# Patient Record
Sex: Female | Born: 1955
Health system: Southern US, Community
[De-identification: ages and names within clinical notes are randomized; demographics above are authoritative.]

## PROBLEM LIST (undated history)

## (undated) DIAGNOSIS — I499 Cardiac arrhythmia, unspecified: Secondary | ICD-10-CM

## (undated) HISTORY — DX: Cardiac arrhythmia, unspecified: I49.9

---

## 2016-09-16 DIAGNOSIS — H33002 Unspecified retinal detachment with retinal break, left eye: Secondary | ICD-10-CM | POA: Diagnosis not present

## 2017-01-13 ENCOUNTER — Other Ambulatory Visit: Payer: Self-pay | Admitting: Obstetrics & Gynecology

## 2017-01-13 DIAGNOSIS — Z1231 Encounter for screening mammogram for malignant neoplasm of breast: Secondary | ICD-10-CM

## 2017-01-29 ENCOUNTER — Ambulatory Visit: Payer: Self-pay | Admitting: Medical

## 2017-01-29 ENCOUNTER — Encounter: Payer: Self-pay | Admitting: Medical

## 2017-01-29 VITALS — BP 104/62 | HR 57 | Temp 97.2°F | Resp 16 | Ht 65.0 in | Wt 156.0 lb

## 2017-01-29 DIAGNOSIS — B351 Tinea unguium: Secondary | ICD-10-CM

## 2017-01-29 NOTE — Progress Notes (Addendum)
Patient states her ob/gyn doctor will be her primary care doctor.  Subjective:    Patient ID: Deborah Fitzpatrick, female    DOB: 1956/02/14, 61 y.o.   MRN: [id number]  HPI 61 yo female comes in for left great toe nail discoloration.  Not painful. Noticed it after hiking in an ill fitting pair of boots. She thought it may have been frost bitten, she states the nail has grown out but the discoloration remains. Presents with husband today in clinic. 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    Review of Systems  Constitutional: Negative for chills and fever.  Skin: Positive for color change.  under and involving left great toe nail only.     Objective:   Physical Exam  Constitutional: She is oriented to person, place, and time. She appears well-developed and well-nourished.  HENT:  Head: Normocephalic and atraumatic.  Eyes: Conjunctivae and EOM are normal. Pupils are equal, round, and reactive to light.  Neurological: She is alert and oriented to person, place, and time.  Skin: Skin is warm and dry.  Psychiatric: She has a normal mood and affect. Her behavior is normal.    Left great toe nail darkened and thickened.non-tender. Toe skin within normal limits <2s CR. No sign of infection or break in toe tissue.FROM.        Assessment & Plan:  Onychomycosis left great toe. Patient states last blood work was  5 years ago , so will do fasting labs to include CBC with diff, Met C, lipid panel , TSH thyroid panel, uric acid and vitamin D level. Then will review labs with patient. Will refer on to podiatry. Return to clinic as needed.Patient states her ob/gyn doctor will be her primary care doctor.

## 2017-02-09 ENCOUNTER — Ambulatory Visit
Admission: RE | Admit: 2017-02-09 | Discharge: 2017-02-09 | Disposition: A | Discharge status: Home / Self Care | Payer: BLUE CROSS/BLUE SHIELD | Source: Ambulatory Visit | Attending: Obstetrics & Gynecology | Admitting: Obstetrics & Gynecology

## 2017-02-09 DIAGNOSIS — Z1231 Encounter for screening mammogram for malignant neoplasm of breast: Secondary | ICD-10-CM | POA: Diagnosis not present

## 2017-02-10 ENCOUNTER — Other Ambulatory Visit: Payer: Self-pay

## 2017-02-10 DIAGNOSIS — Z0000 Encounter for general adult medical examination without abnormal findings: Secondary | ICD-10-CM

## 2017-02-10 NOTE — Addendum Note (Signed)
Addended by: Landry Lookingbill, Herbert Seta R on: 02/10/2017 01:33 PM   Modules accepted: Orders

## 2017-02-11 LAB — CMP12+LP+TP+TSH+6AC+CBC/D/PLT
A/G RATIO: 1.9 (ref 1.2–2.2)
ALBUMIN: 4.3 g/dL (ref 3.5–5.5)
ALT: 19 IU/L (ref 0–32)
AST: 22 IU/L (ref 0–40)
Alkaline Phosphatase: 62 IU/L (ref 39–117)
BASOS ABS: 0.1 10*3/uL (ref 0.0–0.2)
BUN / CREAT RATIO: 14 (ref 9–23)
BUN: 12 mg/dL (ref 6–24)
Basos: 2 %
Bilirubin Total: 0.7 mg/dL (ref 0.0–1.2)
CALCIUM: 9.4 mg/dL (ref 8.7–10.2)
CHOL/HDL RATIO: 2.2 ratio (ref 0.0–4.4)
CHOLESTEROL TOTAL: 172 mg/dL (ref 100–199)
CREATININE: 0.88 mg/dL (ref 0.57–1.00)
Chloride: 101 mmol/L (ref 96–106)
EOS (ABSOLUTE): 0.1 10*3/uL (ref 0.0–0.4)
EOS: 3 %
Estimated CHD Risk: <0.5 times avg. (ref 0.0–1.0)
FREE THYROXINE INDEX: 2.1 (ref 1.2–4.9)
GFR, EST AFRICAN AMERICAN: 83 mL/min/{1.73_m2} (ref >59)
GFR, EST NON AFRICAN AMERICAN: 72 mL/min/{1.73_m2} (ref >59)
GGT: 29 IU/L (ref 0–60)
GLOBULIN, TOTAL: 2.3 g/dL (ref 1.5–4.5)
Glucose: 89 mg/dL (ref 65–99)
HDL: 78 mg/dL (ref >39)
Hematocrit: 38.3 % (ref 34.0–46.6)
Hemoglobin: 13 g/dL (ref 11.1–15.9)
IMMATURE GRANS (ABS): 0 10*3/uL (ref 0.0–0.1)
IMMATURE GRANULOCYTES: 0 %
IRON: 106 ug/dL (ref 27–159)
LDH: 272 IU/L — ABNORMAL HIGH (ref 119–226)
LDL Calculated: 82 mg/dL (ref 0–99)
Lymphocytes Absolute: 2 10*3/uL (ref 0.7–3.1)
Lymphs: 41 %
MCH: 29.9 pg (ref 26.6–33.0)
MCHC: 33.9 g/dL (ref 31.5–35.7)
MCV: 88 fL (ref 79–97)
MONOCYTES: 9 %
Monocytes Absolute: 0.4 10*3/uL (ref 0.1–0.9)
Neutrophils Absolute: 2.2 10*3/uL (ref 1.4–7.0)
Neutrophils: 45 %
PHOSPHORUS: 3.4 mg/dL (ref 2.5–4.5)
PLATELETS: 194 10*3/uL (ref 150–379)
Potassium: 4.6 mmol/L (ref 3.5–5.2)
RBC: 4.35 x10E6/uL (ref 3.77–5.28)
RDW: 14 % (ref 12.3–15.4)
Sodium: 140 mmol/L (ref 134–144)
T3 Uptake Ratio: 30 % (ref 24–39)
T4 TOTAL: 6.9 ug/dL (ref 4.5–12.0)
TOTAL PROTEIN: 6.6 g/dL (ref 6.0–8.5)
TRIGLYCERIDES: 62 mg/dL (ref 0–149)
TSH: 2.41 u[IU]/mL (ref 0.450–4.500)
Uric Acid: 5.3 mg/dL (ref 2.5–7.1)
VLDL Cholesterol Cal: 12 mg/dL (ref 5–40)
WBC: 4.8 10*3/uL (ref 3.4–10.8)

## 2017-02-11 LAB — VITAMIN D 25 HYDROXY (VIT D DEFICIENCY, FRACTURES): VIT D 25 HYDROXY: 13.9 ng/mL — AB (ref 30.0–100.0)

## 2017-02-12 ENCOUNTER — Ambulatory Visit: Payer: BLUE CROSS/BLUE SHIELD | Admitting: Medical

## 2017-02-13 ENCOUNTER — Telehealth: Payer: Self-pay | Admitting: Medical

## 2017-02-13 NOTE — Telephone Encounter (Signed)
Called and left message for patient to return my phone call. Needing to review her labs , and release to my chart.

## 2017-02-14 NOTE — Progress Notes (Unsigned)
Called patient , no answer, left message to return my call. For lab review.

## 2017-02-16 ENCOUNTER — Encounter: Payer: Self-pay | Admitting: Medical

## 2017-02-19 ENCOUNTER — Ambulatory Visit: Payer: BLUE CROSS/BLUE SHIELD | Admitting: Medical

## 2017-02-19 ENCOUNTER — Telehealth: Payer: Self-pay | Admitting: Medical

## 2017-02-19 DIAGNOSIS — E559 Vitamin D deficiency, unspecified: Secondary | ICD-10-CM | POA: Insufficient documentation

## 2017-02-19 NOTE — Telephone Encounter (Signed)
Called patient at her work.  Reviewed labs. LDH elevated will recheck in 1 month, Vitamin D level 13.9 to take otc  4000IU/day of Vitamin D3 daily recheck in one year. Also recommended some sunshine  10-15 min  (she is fair)  Daily if possible. Then cover up with sunscreen and a hat. Return to clinic as needed.

## 2017-02-20 DIAGNOSIS — Z01419 Encounter for gynecological examination (general) (routine) without abnormal findings: Secondary | ICD-10-CM | POA: Diagnosis not present

## 2017-02-23 ENCOUNTER — Ambulatory Visit: Payer: Self-pay | Admitting: Podiatry

## 2017-02-24 ENCOUNTER — Encounter: Payer: Self-pay | Admitting: Podiatry

## 2017-02-24 ENCOUNTER — Ambulatory Visit (INDEPENDENT_AMBULATORY_CARE_PROVIDER_SITE_OTHER): Payer: BLUE CROSS/BLUE SHIELD | Admitting: Podiatry

## 2017-02-24 VITALS — BP 131/72 | HR 46 | Resp 16 | Ht 64.0 in | Wt 150.0 lb

## 2017-02-24 DIAGNOSIS — L603 Nail dystrophy: Secondary | ICD-10-CM | POA: Diagnosis not present

## 2017-02-24 NOTE — Progress Notes (Signed)
   Subjective:    Patient ID: Deborah Fitzpatrick, female    DOB: [birth year], 61 y.o.   MRN: [id number]  HPI    Review of Systems     Objective:   Physical Exam        Assessment & Plan:

## 2017-02-24 NOTE — Progress Notes (Signed)
   Subjective:    Patient ID: Deborah Fitzpatrick, female    DOB: 04-Jul-1956, 61 y.o.   MRN: [id number]  HPI: She presents today with a chief concern of discoloration to the hallux nail plate left foot. States that in December 2017 she is walking with her mother [place] and felt that she may have developed a little bit of frostbite and resulted in rubbing of her toes enters no bruits. She states it seems to be growing and is really not painful.    Review of Systems  All other systems reviewed and are negative.      Objective:   Physical Exam: Vital signs are stable alert and oriented 3 pulses are palpable neurologic since her was intact to reflex are intact muscle strength is normal bilateral. Orthopedic wrist demonstrates all joints distal to the ankle for range of motion or crepitation. Cutaneous evaluation of a straight supple hydrated cutis she does have discoloration of the hallux nail left which appears to be growing out from the eponychia which appears to be clear. So obviously new nail is growing out of it in for that this nail may fall off she understands and is amenable to it.        Assessment & Plan:  Subungual hematoma hallux left  Plan: Follow up with me on an as-needed basis.

## 2017-04-30 DIAGNOSIS — H2513 Age-related nuclear cataract, bilateral: Secondary | ICD-10-CM | POA: Diagnosis not present

## 2017-09-14 ENCOUNTER — Ambulatory Visit: Payer: BLUE CROSS/BLUE SHIELD | Admitting: Medical

## 2017-09-14 ENCOUNTER — Encounter: Payer: Self-pay | Admitting: Medical

## 2017-09-14 VITALS — BP 128/72 | HR 73 | Temp 96.9°F | Wt 155.0 lb

## 2017-09-14 DIAGNOSIS — Z114 Encounter for screening for human immunodeficiency virus [HIV]: Secondary | ICD-10-CM

## 2017-09-14 DIAGNOSIS — E559 Vitamin D deficiency, unspecified: Secondary | ICD-10-CM

## 2017-09-14 DIAGNOSIS — L729 Follicular cyst of the skin and subcutaneous tissue, unspecified: Secondary | ICD-10-CM

## 2017-09-14 DIAGNOSIS — Z1159 Encounter for screening for other viral diseases: Secondary | ICD-10-CM

## 2017-09-14 NOTE — Progress Notes (Signed)
   Subjective:    Patient ID: Deborah Fitzpatrick, female    DOB: [birth year], 61 y.o.   MRN: [id number]  HPI  61 yo  Non acute distress with  3 month bump on left side over the medial aspect of the eye on the forehead. No pain, no itching, no discharge. She says she had similar bumps on her chest and let her husband sterilize a needle and poke at therm and stuff came out, " they healed fine". She would like to see a dermatologist and get it taken off because she wants no scarring on her face. She also states she needs  an annual skin check as well.   Review of Systems  Constitutional: Negative for chills and fever.  HENT: Negative for congestion, ear pain and sore throat.   Eyes: Negative for discharge and itching.  Respiratory: Negative for cough, chest tightness and shortness of breath.   Cardiovascular: Negative for chest pain, palpitations and leg swelling.  Gastrointestinal: Negative for abdominal pain.  Endocrine: Negative for cold intolerance and heat intolerance.  Genitourinary: Negative for dysuria and hematuria.  Musculoskeletal: Negative for myalgias.  Skin: Negative for rash.  Allergic/Immunologic: Positive for environmental allergies. Negative for food allergies.  Neurological: Negative for dizziness, seizures, syncope and headaches.  Hematological: Negative for adenopathy.  Psychiatric/Behavioral: Negative for behavioral problems, self-injury, sleep disturbance and suicidal ideas. The patient is not nervous/anxious.        Objective:   Physical Exam  Constitutional: She is oriented to person, place, and time. She appears well-developed and well-nourished.  HENT:  Head: Normocephalic and atraumatic.  Eyes: Conjunctivae and EOM are normal. Pupils are equal, round, and reactive to light.  Neck: Normal range of motion.  Cardiovascular: Normal rate, regular rhythm and normal heart sounds.  Pulmonary/Chest: Effort normal and breath sounds normal.  Neurological: She is alert and  oriented to person, place, and time.  Skin: Skin is warm and dry.  Psychiatric: She has a normal mood and affect. Her behavior is normal. Judgment and thought content normal.  Nursing note and vitals reviewed.  Forehead left side  Lesion approx  0.4 mm non mobile slightly irregular shaped area, hard feeling..    Assessment & Plan:  Lesion possible a cyst left side of forehead. Will refer to dermatology.  On Vitamin D  5000 IU/day will recheck today.Will also screen for HIV and Hep C.Will contact patient with results.   She brought in documentation of her last Tdap and colonoscopey. Copies made and Scanned into computer under media. She also has had a pap smear but does not have the current paper documentation, she will try to obtain this information. Return to the clinic as needed.

## 2017-09-15 LAB — HCV COMMENT:

## 2017-09-15 LAB — HIV ANTIBODY (ROUTINE TESTING W REFLEX): HIV Screen 4th Generation wRfx: NONREACTIVE

## 2017-09-15 LAB — HEPATITIS C ANTIBODY (REFLEX)

## 2017-09-15 LAB — VITAMIN D 25 HYDROXY (VIT D DEFICIENCY, FRACTURES): VIT D 25 HYDROXY: 48 ng/mL (ref 30.0–100.0)

## 2017-09-16 ENCOUNTER — Telehealth: Payer: Self-pay

## 2017-09-16 NOTE — Telephone Encounter (Signed)
Attempted to contact patient several times to give normal lab results.  Unable to reach patient.

## 2017-09-16 NOTE — Addendum Note (Signed)
Addended by: RATCLIFFE, Herbert SetaHEATHER R on: 09/16/2017 09:05 AM   Modules accepted: Orders

## 2017-09-16 NOTE — Telephone Encounter (Signed)
Pt called requesting her dermat[facility] since [place] in [place] instead of [facility]; Murrell ReddenH. Ratcliffe notified and made new referral [facility] per pt request; notified [facility] to cancel their referral

## 2017-12-17 DIAGNOSIS — L821 Other seborrheic keratosis: Secondary | ICD-10-CM | POA: Diagnosis not present

## 2017-12-17 DIAGNOSIS — D2261 Melanocytic nevi of right upper limb, including shoulder: Secondary | ICD-10-CM | POA: Diagnosis not present

## 2017-12-17 DIAGNOSIS — L814 Other melanin hyperpigmentation: Secondary | ICD-10-CM | POA: Diagnosis not present

## 2017-12-17 DIAGNOSIS — D1801 Hemangioma of skin and subcutaneous tissue: Secondary | ICD-10-CM | POA: Diagnosis not present

## 2017-12-28 ENCOUNTER — Other Ambulatory Visit: Payer: Self-pay | Admitting: Obstetrics & Gynecology

## 2017-12-28 DIAGNOSIS — Z1231 Encounter for screening mammogram for malignant neoplasm of breast: Secondary | ICD-10-CM

## 2017-12-31 DIAGNOSIS — K1379 Other lesions of oral mucosa: Secondary | ICD-10-CM | POA: Diagnosis not present

## 2018-01-11 DIAGNOSIS — K1379 Other lesions of oral mucosa: Secondary | ICD-10-CM | POA: Diagnosis not present

## 2018-01-14 DIAGNOSIS — K1379 Other lesions of oral mucosa: Secondary | ICD-10-CM | POA: Diagnosis not present

## 2018-02-08 ENCOUNTER — Telehealth: Payer: Self-pay | Admitting: Medical

## 2018-02-08 NOTE — Telephone Encounter (Signed)
I will contact patient.

## 2018-02-10 ENCOUNTER — Encounter: Payer: Self-pay | Admitting: Medical

## 2018-02-10 ENCOUNTER — Other Ambulatory Visit: Payer: Self-pay | Admitting: Medical

## 2018-02-10 DIAGNOSIS — E559 Vitamin D deficiency, unspecified: Secondary | ICD-10-CM

## 2018-02-10 NOTE — Progress Notes (Signed)
Patient needing recheck on her Vitamin D level. Will talk to patient when she comes in for her test.

## 2018-02-15 ENCOUNTER — Ambulatory Visit
Admission: RE | Admit: 2018-02-15 | Discharge: 2018-02-15 | Disposition: A | Discharge status: Home / Self Care | Payer: BLUE CROSS/BLUE SHIELD | Source: Ambulatory Visit | Attending: Obstetrics & Gynecology | Admitting: Obstetrics & Gynecology

## 2018-02-15 DIAGNOSIS — Z1231 Encounter for screening mammogram for malignant neoplasm of breast: Secondary | ICD-10-CM | POA: Diagnosis not present

## 2018-02-25 DIAGNOSIS — Z01419 Encounter for gynecological examination (general) (routine) without abnormal findings: Secondary | ICD-10-CM | POA: Diagnosis not present

## 2018-03-09 ENCOUNTER — Ambulatory Visit: Payer: BLUE CROSS/BLUE SHIELD | Admitting: Medical

## 2018-03-09 ENCOUNTER — Other Ambulatory Visit: Payer: BLUE CROSS/BLUE SHIELD | Admitting: Medical

## 2018-03-09 VITALS — BP 133/74 | HR 50 | Temp 98.4°F | Resp 16 | Ht 64.0 in | Wt 153.0 lb

## 2018-03-09 DIAGNOSIS — E861 Hypovolemia: Secondary | ICD-10-CM

## 2018-03-09 DIAGNOSIS — E559 Vitamin D deficiency, unspecified: Secondary | ICD-10-CM

## 2018-03-09 DIAGNOSIS — H6983 Other specified disorders of Eustachian tube, bilateral: Secondary | ICD-10-CM

## 2018-03-09 DIAGNOSIS — Z0000 Encounter for general adult medical examination without abnormal findings: Secondary | ICD-10-CM

## 2018-03-09 DIAGNOSIS — I9589 Other hypotension: Secondary | ICD-10-CM

## 2018-03-09 NOTE — Progress Notes (Signed)
 Subjective:    Patient ID: Deborah Fitzpatrick, female    DOB: 08/01/1956, 62 y.o.   MRN: [id number]  HPI  62 yo female patient in non acute distress. Presents with husband Jeffery Monk. Patient returns for recheck on Vitamin D. She currently is taking 5000 IU/day. She does want the [facility] to be her primary doctor.She is granfathered in and will schedule for a full physical exam .   Seen by Ob /GYN  At  [facility] [place]. Last pap smear was  2 years ago and so she says does not need one for another year ( she says she needs one every 3 years since her last pap smear was negative) which would be due next year.  Checking blood pressue  at home sometimes as low as  85/40 gets lightheadedness, says she drinks something and the symptoms resolve.   Patient says does drink throughout the day.  But it is usually drinking herbal tea.  History of  Syncope 3 times before, (once  at 62 yo, another time was when she took codeine and took a hot shower, the then other time was about 15 years ago), in the past, nothing recent.  History of heart arrythmia but cannot recall the type of arrhythmia.Was told it was not serious  Patient bears down and holds breath and it resolves. Her father was a physician and taught her this.  Will call me with arrythmia type.   Blood pressure 133/74, pulse (!) 50, temperature 98.4 F (36.9 C), temperature source Tympanic, resp. rate 16, height 5' 4" (1.626 m), weight 153 lb (69.4 kg), SpO2 99 %.   Review of Systems  Constitutional: Negative.   HENT: Positive for congestion and sinus pressure (maxillary, sometimes). Negative for ear pain, sneezing and sore throat.   Eyes: Negative for discharge, itching and visual disturbance.  Respiratory: Negative for cough, chest tightness, shortness of breath and wheezing.   Cardiovascular: Negative for chest pain, palpitations and leg swelling.  Gastrointestinal: Negative for abdominal distention, abdominal  pain, anal bleeding, blood in stool, constipation, diarrhea, nausea, rectal pain and vomiting.  Endocrine: Positive for cold intolerance. Negative for heat intolerance, polydipsia, polyphagia and polyuria.  Genitourinary: Negative for difficulty urinating, dysuria, flank pain, frequency, hematuria, pelvic pain and urgency.  Musculoskeletal: Negative.   Skin: Negative.        Seen by [facility] and all was okay , has a recheck in one year.   Allergic/Immunologic: Positive for environmental allergies. Negative for food allergies and immunocompromised state.  Neurological: Positive for light-headedness (with low blood pressure on a 62 yo cuff gets this once a week. drinks something and symptom resolves.). Negative for syncope.  Hematological: Bruises/bleeds easily (easily bruises with trauma).  Psychiatric/Behavioral: Positive for sleep disturbance ( "a few problems with waking up abut 5 days/ night").      Has read up on sleep hygiene. Trouble sleeping 5 days / week , tries to avoid caffiene past  Noon.Was going to try Melatonin but has not started it yet.  And just has a one cup of coffee in the morning once a week. 15 years ago has cardilogy work up .  Uses mast cell inhibitor for eye redness ( cannot recall the name of medication) and uses it maybe once a month. Hx of retinal tear 2015 left eye, does have floaterssand is near- sited. Sees Doctor at [facility].  Sees [facility] for lenses.  Tendency to faint in the family with   mother, brother and one of her sons having fainting symptoms. Sue has not had this.  Gets blood sugar lows as well. Needs to eat every 3-4 hours. Eating oatmeal / nuts/ bannana for breatfast, lunch is a peanut butter or cheese sandwhich, dinner is usually chicken or fish 2 x / weekly and beef 2 x/ month, and then vegatable meal. yogurt, non-fat.  Since she was 22 has had a subluxing peroneal tendon left leg and wears orthotics and flat shoes to  prevent it subluxing.     Seen by Dr. Hyatt had trauma to right great toe nail and it would grow out in a year, it now looks normal.   Ate a piece of bread and herb tea no milk or sugar this morning..  Objective:   Physical Exam  Constitutional: She is oriented to person, place, and time. She appears well-developed and well-nourished.  HENT:  Head: Normocephalic and atraumatic.  Right Ear: External ear normal.  Left Ear: External ear normal.  Mouth/Throat: Oropharynx is clear and moist.  Eyes: Pupils are equal, round, and reactive to light. Conjunctivae and EOM are normal.  Neck: Normal range of motion. Neck supple.  Cardiovascular: Normal rate, regular rhythm and normal heart sounds. Exam reveals no gallop and no friction rub.  No murmur heard. Pulmonary/Chest: Effort normal and breath sounds normal.  Neurological: She is alert and oriented to person, place, and time.  Skin: Skin is warm and dry.  Psychiatric: She has a normal mood and affect. Her behavior is normal. Judgment and thought content normal.  Nursing note and vitals reviewed.         Assessment & Plan:   Possibly Hypotentsion  likely due to hypovolemia as her symptoms resolve if she drinks something. Sleeping disturbance, try  Melatonin 1 mg / night may titrate up to  5mg. Drink water and add in a gatorade or powerade in daily. Get new blood pressure cuff, current blood pressure cuff is 62 years old and check BP and heart rate daily for one week. Let me know your numbers. Husband asks what if we cannot get a number on the cuff, if patient is symptomatic to call 911 and be evaluated by an Emergency Department.  He and she verbalizes understanding. Labs drawn today, Exec panel which includes  CBC , Met C , TSH and Lipid panel  And Vit D level will call patient with results.. Offered EKG and patient wanted to wait due to time restraints today, until her physical exam, which she will schedule within the next 2 weeks. She is  to call me once she checks her paperwork at home on what type of arrhythmia she has been diagnosed with in the past. Patient verbalizes understanding and has no questions at discharge.   Follow up email sent on  03/12/2018. Reviewed labs and asked for heart rhythm diagnosis and  BP and HR numbers. 

## 2018-03-09 NOTE — Patient Instructions (Addendum)
Call me with the information on your heart arrythymia. Get a new blood pressure cuff. Check blood pressure daily. Try hydrated with gatorade or powerade daily. Schedule for primary care appointment.   Hypotension As your heart beats, it forces blood through your body. This force is called blood pressure. If you have hypotension, you have low blood pressure. When your blood pressure is too low, you may not get enough blood to your brain. You may feel weak, feel light-headed, have a fast heartbeat, or even pass out (faint). Follow these instructions at home: Eating and drinking  Drink enough fluids to keep your pee (urine) clear or pale yellow.  Eat a healthy diet, and follow instructions from your doctor about eating or drinking restrictions. A healthy diet includes: ? Fresh fruits and vegetables. ? Whole grains. ? Low-fat (lean) meats. ? Low-fat dairy products.  Eat extra salt only as told. Do not add extra salt to your diet unless your doctor tells you to.  Eat small meals often.  Avoid standing up quickly after you eat. Medicines  Take over-the-counter and prescription medicines only as told by your doctor. ? Follow instructions from your doctor about changing how much you take (the dosage) of your medicines, if this applies. ? Do not stop or change your medicine on your own. General instructions  Wear compression stockings as told by your doctor.  Get up slowly from lying down or sitting.  Avoid hot showers and a lot of heat as told by your doctor.  Return to your normal activities as told by your doctor. Ask what activities are safe for you.  Do not use any products that contain nicotine or tobacco, such as cigarettes and e-cigarettes. If you need help quitting, ask your doctor.  Keep all follow-up visits as told by your doctor. This is important. Contact a doctor if:  You throw up (vomit).  You have watery poop (diarrhea).  You have a fever for more than 2-3  days.  You feel more thirsty than normal.  You feel weak and tired. Get help right away if:  You have chest pain.  You have a fast or irregular heartbeat.  You lose feeling (get numbness) in any part of your body.  You cannot move your arms or your legs.  You have trouble talking.  You get sweaty or feel light-headed.  You faint.  You have trouble breathing.  You have trouble staying awake.  You feel confused. This information is not intended to replace advice given to you by your health care provider. Make sure you discuss any questions you have with your health care provider. Document Released: 01/07/2010 Document Revised: 07/01/2016 Document Reviewed: 07/01/2016 Elsevier Interactive Patient Education  2017 [employer]  Eustachian Tube Dysfunction The eustachian tube connects the middle ear to the back of the nose. It regulates air pressure in the middle ear by allowing air to move between the ear and nose. It also helps to drain fluid from the middle ear space. When the eustachian tube does not function properly, air pressure, fluid, or both can build up in the middle ear. Eustachian tube dysfunction can affect one or both ears. What are the causes? This condition happens when the eustachian tube becomes blocked or cannot open normally. This may result from:  Ear infections.  Colds and other upper respiratory infections.  Allergies.  Irritation, such as from cigarette smoke or acid from the stomach coming up into the esophagus (gastroesophageal reflux).  Sudden changes in air  pressure, such as from descending in an airplane.  Abnormal growths in the nose or throat, such as nasal polyps, tumors, or enlarged tissue at the back of the throat (adenoids).  What increases the risk? This condition may be more likely to develop in people who smoke and people who are overweight. Eustachian tube dysfunction may also be more likely to develop in children, especially  children who have:  Certain birth defects of the mouth, such as cleft palate.  Large tonsils and adenoids.  What are the signs or symptoms? Symptoms of this condition may include:  A feeling of fullness in the ear.  Ear pain.  Clicking or popping noises in the ear.  Ringing in the ear.  Hearing loss.  Loss of balance.  Symptoms may get worse when the air pressure around you changes, such as when you travel to an area of high elevation or fly on an airplane. How is this diagnosed? This condition may be diagnosed based on:  Your symptoms.  A physical exam of your ear, nose, and throat.  Tests, such as those that measure: ? The movement of your eardrum (tympanogram). ? Your hearing (audiometry).  How is this treated? Treatment depends on the cause and severity of your condition. If your symptoms are mild, you may be able to relieve your symptoms by moving air into ("popping") your ears. If you have symptoms of fluid in your ears, treatment may include:  Decongestants.  Antihistamines.  Nasal sprays or ear drops that contain medicines that reduce swelling (steroids).  In some cases, you may need to have a procedure to drain the fluid in your eardrum (myringotomy). In this procedure, a small tube is placed in the eardrum to:  Drain the fluid.  Restore the air in the middle ear space.  Follow these instructions at home:  Take over-the-counter and prescription medicines only as told by your health care provider.  Use techniques to help pop your ears as recommended by your health care provider. These may include: ? Chewing gum. ? Yawning. ? Frequent, forceful swallowing. ? Closing your mouth, holding your nose closed, and gently blowing as if you are trying to blow air out of your nose.  Do not do any of the following until your health care provider approves: ? Travel to high altitudes. ? Fly in airplanes. ? Work in a Estate agent or room. ? Scuba  dive.  Keep your ears dry. Dry your ears completely after showering or bathing.  Do not smoke.  Keep all follow-up visits as told by your health care provider. This is important. Contact a health care provider if:  Your symptoms do not go away after treatment.  Your symptoms come back after treatment.  You are unable to pop your ears.  You have: ? A fever. ? Pain in your ear. ? Pain in your head or neck. ? Fluid draining from your ear.  Your hearing suddenly changes.  You become very dizzy.  You lose your balance. This information is not intended to replace advice given to you by your health care provider. Make sure you discuss any questions you have with your health care provider. Document Released: 11/09/2015 Document Revised: 03/20/2016 Document Reviewed: 11/01/2014 Elsevier Interactive Patient Education  [employer].

## 2018-03-10 LAB — CMP12+LP+TP+TSH+6AC+CBC/D/PLT
A/G RATIO: 1.9 (ref 1.2–2.2)
ALBUMIN: 4.4 g/dL (ref 3.6–4.8)
ALT: 18 IU/L (ref 0–32)
AST: 22 IU/L (ref 0–40)
Alkaline Phosphatase: 65 IU/L (ref 39–117)
BASOS ABS: 0.1 10*3/uL (ref 0.0–0.2)
BILIRUBIN TOTAL: 0.6 mg/dL (ref 0.0–1.2)
BUN/Creatinine Ratio: 24 (ref 12–28)
BUN: 18 mg/dL (ref 8–27)
Basos: 1 %
CHOLESTEROL TOTAL: 168 mg/dL (ref 100–199)
Calcium: 9.6 mg/dL (ref 8.7–10.3)
Chloride: 105 mmol/L (ref 96–106)
Chol/HDL Ratio: 2.1 ratio (ref 0.0–4.4)
Creatinine, Ser: 0.75 mg/dL (ref 0.57–1.00)
EOS (ABSOLUTE): 0.1 10*3/uL (ref 0.0–0.4)
Eos: 3 %
FREE THYROXINE INDEX: 1.9 (ref 1.2–4.9)
GFR calc Af Amer: 99 mL/min/{1.73_m2} (ref >59)
GFR, EST NON AFRICAN AMERICAN: 86 mL/min/{1.73_m2} (ref >59)
GGT: 25 IU/L (ref 0–60)
GLOBULIN, TOTAL: 2.3 g/dL (ref 1.5–4.5)
GLUCOSE: 122 mg/dL — AB (ref 65–99)
HDL: 79 mg/dL (ref >39)
Hematocrit: 39.6 % (ref 34.0–46.6)
Hemoglobin: 13.4 g/dL (ref 11.1–15.9)
IMMATURE GRANULOCYTES: 0 %
IRON: 111 ug/dL (ref 27–139)
Immature Grans (Abs): 0 10*3/uL (ref 0.0–0.1)
LDH: 173 IU/L (ref 119–226)
LDL Calculated: 70 mg/dL (ref 0–99)
LYMPHS ABS: 2.1 10*3/uL (ref 0.7–3.1)
Lymphs: 38 %
MCH: 29.8 pg (ref 26.6–33.0)
MCHC: 33.8 g/dL (ref 31.5–35.7)
MCV: 88 fL (ref 79–97)
Monocytes Absolute: 0.4 10*3/uL (ref 0.1–0.9)
Monocytes: 7 %
NEUTROS PCT: 51 %
Neutrophils Absolute: 2.9 10*3/uL (ref 1.4–7.0)
PHOSPHORUS: 3.5 mg/dL (ref 2.5–4.5)
PLATELETS: 201 10*3/uL (ref 150–379)
Potassium: 4.5 mmol/L (ref 3.5–5.2)
RBC: 4.49 x10E6/uL (ref 3.77–5.28)
RDW: 13.9 % (ref 12.3–15.4)
Sodium: 140 mmol/L (ref 134–144)
T3 UPTAKE RATIO: 29 % (ref 24–39)
T4 TOTAL: 6.6 ug/dL (ref 4.5–12.0)
TSH: 2.12 u[IU]/mL (ref 0.450–4.500)
Total Protein: 6.7 g/dL (ref 6.0–8.5)
Triglycerides: 96 mg/dL (ref 0–149)
URIC ACID: 5.2 mg/dL (ref 2.5–7.1)
VLDL CHOLESTEROL CAL: 19 mg/dL (ref 5–40)
WBC: 5.6 10*3/uL (ref 3.4–10.8)

## 2018-03-10 LAB — VITAMIN D 25 HYDROXY (VIT D DEFICIENCY, FRACTURES): Vit D, 25-Hydroxy: 54.6 ng/mL (ref 30.0–100.0)

## 2018-03-12 ENCOUNTER — Encounter: Payer: Self-pay | Admitting: Medical

## 2018-03-17 NOTE — Telephone Encounter (Signed)
Do not recall phone call to patient . Have talked to patient since.  See notes in Epic.

## 2018-03-18 ENCOUNTER — Encounter: Payer: Self-pay | Admitting: Medical

## 2018-03-18 NOTE — Telephone Encounter (Signed)
I did thank you.

## 2018-03-30 ENCOUNTER — Ambulatory Visit: Payer: BLUE CROSS/BLUE SHIELD | Admitting: Medical

## 2018-03-30 ENCOUNTER — Encounter: Payer: Self-pay | Admitting: Medical

## 2018-03-30 VITALS — BP 141/79 | HR 53 | Temp 97.1°F | Resp 16 | Ht 64.0 in | Wt 153.2 lb

## 2018-03-30 DIAGNOSIS — R5383 Other fatigue: Secondary | ICD-10-CM

## 2018-03-30 DIAGNOSIS — Z0000 Encounter for general adult medical examination without abnormal findings: Secondary | ICD-10-CM

## 2018-03-30 DIAGNOSIS — R030 Elevated blood-pressure reading, without diagnosis of hypertension: Secondary | ICD-10-CM

## 2018-03-30 DIAGNOSIS — R55 Syncope and collapse: Secondary | ICD-10-CM

## 2018-03-30 LAB — POCT URINALYSIS DIPSTICK
Bilirubin, UA: NEGATIVE
Glucose, UA: NEGATIVE
KETONES UA: NEGATIVE
LEUKOCYTES UA: NEGATIVE
NITRITE UA: 0.2
PH UA: 6 (ref 5.0–8.0)
Protein, UA: NEGATIVE
RBC UA: NEGATIVE
UROBILINOGEN UA: NEGATIVE U/dL — AB

## 2018-03-30 NOTE — Patient Instructions (Addendum)
Heart-Healthy Eating Plan Heart-healthy meal planning includes:  Limiting unhealthy fats.  Increasing healthy fats.  Making other small dietary changes.  You may need to talk with your doctor or a diet specialist (dietitian) to create an eating plan that is right for you. What types of fat should I choose?  Choose healthy fats. These include olive oil and canola oil, flaxseeds, walnuts, almonds, and seeds.  Eat more omega-3 fats. These include salmon, mackerel, sardines, tuna, flaxseed oil, and ground flaxseeds. Try to eat fish at least twice each week.  Limit saturated fats. ? Saturated fats are often found in animal products, such as meats, butter, and cream. ? Plant sources of saturated fats include palm oil, palm kernel oil, and coconut oil.  Avoid foods with partially hydrogenated oils in them. These include stick margarine, some tub margarines, cookies, crackers, and other baked goods. These contain trans fats. What general guidelines do I need to follow?  Check food labels carefully. Identify foods with trans fats or high amounts of saturated fat.  Fill one half of your plate with vegetables and green salads. Eat 4-5 servings of vegetables per day. A serving of vegetables is: ? 1 cup of raw leafy vegetables. ?  cup of raw or cooked cut-up vegetables. ?  cup of vegetable juice.  Fill one fourth of your plate with whole grains. Look for the word "whole" as the first word in the ingredient list.  Fill one fourth of your plate with lean protein foods.  Eat 4-5 servings of fruit per day. A serving of fruit is: ? One medium whole fruit. ?  cup of dried fruit. ?  cup of fresh, frozen, or canned fruit. ?  cup of 100% fruit juice.  Eat more foods that contain soluble fiber. These include apples, broccoli, carrots, beans, peas, and barley. Try to get 20-30 g of fiber per day.  Eat more home-cooked food. Eat less restaurant, buffet, and fast food.  Limit or avoid  alcohol.  Limit foods high in starch and sugar.  Avoid fried foods.  Avoid frying your food. Try baking, boiling, grilling, or broiling it instead. You can also reduce fat by: ? Removing the skin from poultry. ? Removing all visible fats from meats. ? Skimming the fat off of stews, soups, and gravies before serving them. ? Steaming vegetables in water or broth.  Lose weight if you are overweight.  Eat 4-5 servings of nuts, legumes, and seeds per week: ? One serving of dried beans or legumes equals  cup after being cooked. ? One serving of nuts equals 1 ounces. ? One serving of seeds equals  ounce or one tablespoon.  You may need to keep track of how much salt or sodium you eat. This is especially true if you have high blood pressure. Talk with your doctor or dietitian to get more information. What foods can I eat? Grains Breads, including French, white, pita, wheat, raisin, rye, oatmeal, and Italian. Tortillas that are neither fried nor made with lard or trans fat. Low-fat rolls, including hotdog and hamburger buns and English muffins. Biscuits. Muffins. Waffles. Pancakes. Light popcorn. Whole-grain cereals. Flatbread. Melba toast. Pretzels. Breadsticks. Rusks. Low-fat snacks. Low-fat crackers, including oyster, saltine, matzo, graham, animal, and rye. Rice and pasta, including brown rice and pastas that are made with whole wheat. Vegetables All vegetables. Fruits All fruits, but limit coconut. Meats and Other Protein Sources Lean, well-trimmed beef, veal, pork, and lamb. Chicken and turkey without skin. All fish and shellfish.   Wild duck, rabbit, pheasant, and venison. Egg whites or low-cholesterol egg substitutes. Dried beans, peas, lentils, and tofu. Seeds and most nuts. Dairy Low-fat or nonfat cheeses, including ricotta, string, and mozzarella. Skim or 1% milk that is liquid, powdered, or evaporated. Buttermilk that is made with low-fat milk. Nonfat or low-fat  yogurt. Beverages Mineral water. Diet carbonated beverages. Sweets and Desserts Sherbets and fruit ices. Honey, jam, marmalade, jelly, and syrups. Meringues and gelatins. Pure sugar candy, such as hard candy, jelly beans, gumdrops, mints, marshmallows, and small amounts of dark chocolate. [employer]. Eat all sweets and desserts in moderation. Fats and Oils Nonhydrogenated (trans-free) margarines. Vegetable oils, including soybean, sesame, sunflower, olive, peanut, safflower, corn, canola, and cottonseed. Salad dressings or mayonnaise made with a vegetable oil. Limit added fats and oils that you use for cooking, baking, salads, and as spreads. Other Cocoa powder. Coffee and tea. All seasonings and condiments. The items listed above may not be a complete list of recommended foods or beverages. Contact your dietitian for more options. What foods are not recommended? Grains Breads that are made with saturated or trans fats, oils, or whole milk. Croissants. Butter rolls. Cheese breads. Sweet rolls. Donuts. Buttered popcorn. Chow mein noodles. High-fat crackers, such as cheese or butter crackers. Meats and Other Protein Sources Fatty meats, such as hotdogs, short ribs, sausage, spareribs, bacon, rib eye roast or steak, and mutton. High-fat deli meats, such as salami and bologna. Caviar. Domestic duck and goose. Organ meats, such as kidney, liver, sweetbreads, and heart. Dairy Cream, sour cream, cream cheese, and creamed cottage cheese. Whole-milk cheeses, including blue (bleu), [address], [place], [place], [address], [place], [address], cheddar, [place], and [place]. Whole or 2% milk that is liquid, evaporated, or condensed. Whole buttermilk. Cream sauce or high-fat cheese sauce. Yogurt that is made from whole milk. Beverages Regular sodas and juice drinks with added sugar. Sweets and Desserts Frosting. Pudding. Cookies. Cakes other than angel food cake. Candy that has milk chocolate or white  chocolate, hydrogenated fat, butter, coconut, or unknown ingredients. Buttered syrups. Full-fat ice cream or ice cream drinks. Fats and Oils Gravy that has suet, meat fat, or shortening. Cocoa butter, hydrogenated oils, palm oil, coconut oil, palm kernel oil. These can often be found in baked products, candy, fried foods, nondairy creamers, and whipped toppings. Solid fats and shortenings, including bacon fat, salt pork, lard, and butter. Nondairy cream substitutes, such as coffee creamers and sour cream substitutes. Salad dressings that are made of unknown oils, cheese, or sour cream. The items listed above may not be a complete list of foods and beverages to avoid. Contact your dietitian for more information. This information is not intended to replace advice given to you by your health care provider. Make sure you discuss any questions you have with your health care provider. Document Released: 04/13/2012 Document Revised: 03/20/2016 Document Reviewed: 04/06/2014 Elsevier Interactive Patient Education  2018 [employer]. Cooking With Less [employer] with less salt is one way to reduce the amount of sodium you get from food. Depending on your condition and overall health, your health care provider or diet and nutrition specialist (dietitian) may recommend that you reduce your sodium intake. Most people should have less than 2,300 milligrams (mg) of sodium each day. If you have high blood pressure (hypertension), you may need to limit your sodium to 1,500 mg each day. Follow the tips below to help reduce your sodium intake. What do I need to know about cooking with less salt? Shopping  Buy sodium-free or low-sodium products. Look for the following words on food labels: ? Low-sodium. ? Sodium-free. ? Reduced-sodium. ? No salt added. ? Unsalted.  Buy fresh or frozen vegetables. Avoid canned vegetables.  Avoid buying meats or protein foods that have been injected with broth or saline  solution.  Avoid cured or smoked meats, such as hot dogs, bacon, salami, ham, and bologna. Reading food labels  Check the food label before buying or using packaged ingredients.  Look for products with no more than 140 mg of sodium in one serving.  Do not choose foods with salt as one of the first three ingredients on the ingredients list. If salt is one of the first three ingredients, it usually means the item is high in sodium, because ingredients are listed in order of amount in the food item. Cooking  Use herbs, seasonings without salt, and spices as substitutes for salt in foods.  Use sodium-free baking soda when baking.  Grill, braise, or roast foods to add flavor with less salt.  Avoid adding salt to pasta, rice, or hot cereals while cooking.  Drain and rinse canned vegetables before use.  Avoid adding salt when cooking sweets and desserts.  Cook with low-sodium ingredients. What are some salt alternatives? The following are herbs, seasonings, and spices that can be used instead of salt to give taste to your food. Herbs should be fresh or dried. Do not choose packaged mixes. Next to the name of the herb, spice, or seasoning are some examples of foods you can pair it with. Herbs  Bay leaves - Soups, meat and vegetable dishes, and spaghetti sauce.  Basil - [employer] In[employer]alian dishes, soups, pasta, and fish dishes.  Cilantro - Meat, poultry, and vegetable dishes.  Chili powder - Marinades and Mexican dishes.  Chives - Salad dressings and potato dishes.  Cumin - Mexican dishes, couscous, and meat dishes.  Dill - Fish dishes, sauces, and salads.  Fennel - Meat and vegetable dishes, breads, and cookies.  Garlic (do not use garlic salt) - [place] dishes, meat dishes, salad dressings, and sauces.  Marjoram - Soups, potato dishes, and meat dishes.  Oregano - Pizza and spaghetti sauce.  Parsley - Salads, soups, pasta, and meat dishes.  Rosemary - [place] dishes, salad dressings,  soups, and red meats.  Saffron - Fish dishes, pasta, and some poultry dishes.  Sage - Stuffings and sauces.  Tarragon - Fish and Whole Foodspoultry dishes.  Thyme - Stuffing, meat, and fish dishes. Seasonings  Lemon juice - Fish dishes, poultry dishes, vegetables, and salads.  Vinegar - Salad dressings, vegetables, and fish dishes. Spices  Cinnamon - Sweet dishes, such as cakes, cookies, and puddings.  Cloves - Gingerbread, puddings, and marinades for meats.  Curry - Vegetable dishes, fish and poultry dishes, and stir-fry dishes.  Ginger - Vegetables dishes, fish dishes, and stir-fry dishes.  Nutmeg - Pasta, vegetables, poultry, fish dishes, and custard. What are some low-sodium ingredients and foods?  Fresh or frozen fruits and vegetables with no sauce added.  Fresh or frozen whole meats, poultry, and fish with no sauce added.  Eggs.  Noodles, pasta, quinoa, rice.  Shredded or puffed wheat or puffed rice.  Regular or quick oats.  Milk, yogurt, hard cheeses, and low-sodium cheeses. Good cheese choices include Swiss, [employer], and [address]lla. Always check the label for the serving size and sodium content.  Unsalted butter or margarine.  Unsalted nuts.  Sherbet or ice cream (keep to  cup per serving).  Homemade pudding.  Sodium-free baking soda and baking powder. This is not a complete list of low-sodium ingredients and foods. Contact your dietitian for more options. Summary  Cooking with less salt is one way to reduce the amount of sodium that you get from food.  Buy sodium-free or low-sodium products.  Check the food label before using or buying packaged ingredients.  Use herbs, seasonings without salt, and spices as substitutes for salt in foods. This information is not intended to replace advice given to you by your health care provider. Make sure you discuss any questions you have with your health care provider. Document Released: 10/13/2005 Document  Revised: 10/21/2016 Document Reviewed: 10/21/2016 Elsevier Interactive Patient Education  2017 [employer].

## 2018-03-30 NOTE — Progress Notes (Addendum)
Subjective:    Patient ID: Deborah Fitzpatrick, female    DOB: 1956/10/11, 62 y.o.   MRN: [id number]  HPI  62 yo female in non acute distress with no complaints today.  Here for primary care exam.  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 She has brought in a record of her blood pressure readings since our last visit with all the readings occurring about  9 pm.asked patient to record mid day readings, it will be easier for her to do after the tri-semester ends shortly and she is on summer break.  She says she exercises daily with swimming or walking,  4-5 times per week  and Yoga one day of the week.  She has an Eye appointment in August ( just recently rescheduled by the doctors office).She also reports she has a history of a retinal tear 5 years ago, she has lots of floaters and she is nearsighted and wears bifocals.Last visit was  One year ago. Recently seen by her OB/GYN  at [place] , she reports all was fine and is due for her pap smear next year.She also has seen a Dermatology for a skin check and all was "fine".  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 She says she drank a lot of water during the event.  During fasting labs patient drank coffee with creamer so she was not fasting this explains elevated glucose.  Blood pressure (!) 141/79, pulse (!) 53, temperature (!) 97.1 F (36.2 C), temperature source Tympanic, resp. rate 16, height '5\' 4"'  (1.626 m), weight 153 lb 3.2 oz (69.5 kg), SpO2 99 %.  Review of Systems  Constitutional: Positive for unexpected weight change (gained  10lbs since moving here ," we don't do any yard work anymore ").  HENT: Negative.   Eyes: Negative.   Respiratory: Negative.   Cardiovascular: Negative.   Gastrointestinal: Negative.   Endocrine: Positive for cold  intolerance (colder than her husband ," like when in restuarants etc"., she says she  always wears a Designer, fashion/clothing).  Genitourinary: Negative.   Musculoskeletal: Negative.   Skin: Negative.   Allergic/Immunologic: Positive for environmental allergies (spring tree pollen).  Neurological: Negative.   Hematological: Positive for adenopathy ("one node on both side of neck that never completely resolved since having mono at  62 yrs old"). Bruises/bleeds easily: "I am like my mother we both bruise easily"  Psychiatric/Behavioral: Negative.    History of  Feeling bad , nauseaous ( "usually occurring on a hard day where I may not have drank enough water ") every 2 months, she then pushes fluids and the symptoms resolve in 20 -30 minutes per patient. Last time she fainted was in 2003 and was seen by a heart doctor who said she had an arrythmia.  Twice a year she feels her heart out of sync, and "my father was a doctor and told me to bear down and it would fix her rhythm."     Objective:   Physical Exam  Constitutional: She is oriented to person, place, and time. She appears well-developed and well-nourished.  HENT:  Head: Normocephalic and atraumatic.  Right Ear: Hearing, external ear and ear canal normal. A middle ear effusion is present.  Left Ear: Hearing, tympanic membrane, external ear and ear canal normal.  Mouth/Throat: Oropharynx is clear and moist.  Eyes: Pupils are equal, round, and reactive to light. Conjunctivae, EOM and lids are normal.  Neck: Trachea normal, normal range of motion and full passive range of motion without pain. Neck supple. No thyromegaly present.  Cardiovascular: Normal rate, regular rhythm, normal heart sounds, intact distal pulses and normal pulses.  Pulses:      Carotid pulses are 2+ on the right side, and 2+ on the left side.      Radial pulses are 2+ on the right side, and 2+ on the left side.       Popliteal pulses are 2+ on the right side, and 2+ on the left side.        Dorsalis pedis pulses are 2+ on the right side, and 2+ on the left side.       Posterior tibial pulses are 2+ on the right side, and 2+ on the left side.  Pulmonary/Chest: Effort normal and breath sounds normal.  Abdominal: Soft. Normal appearance, normal aorta and bowel sounds are normal. There is no hepatosplenomegaly.  Musculoskeletal: Normal range of motion.       Right shoulder: Normal.       Left shoulder: Normal.       Right elbow: Normal.      Left elbow: Normal.       Right wrist: Normal.       Left wrist: Normal.       Right hip: Normal.       Left hip: Normal.       Right knee: Normal.       Left knee: Normal.       Right ankle: Normal.       Left ankle: Normal.       Cervical back: Normal.       Thoracic back: Normal.       Lumbar back: Normal.       Right upper arm: Normal.       Left upper arm: Normal.       Right forearm: Normal.       Left forearm: Normal.       Left hand: Normal.       Right upper leg: Normal.       Left upper leg: Normal.       Right lower leg: Normal.       Left lower leg: Normal.       Right foot: Normal.       Left foot: Normal.  Neurological: She is alert and oriented to person, place, and time. She has normal strength. No cranial nerve deficit or sensory deficit. She displays a negative Romberg sign.  Skin: Skin is warm, dry and intact. Capillary refill takes less than 2 seconds.  Psychiatric: She has a normal mood and affect. Her speech is normal and behavior is normal. Judgment and thought content normal. Cognition and memory are normal.  Nursing note and vitals reviewed.    EKG bradycardia 51  Borderline 1st degree AV Block (she says she has known arrhythmia but does not know type).      Assessment & Plan:  Computers not working today. Physical exam for primary care Reviewed labs with patient. Will do MMR titer today. Will call patient once I get results. Reviewed patient's  blood pressure recordings ( at  910pm )  highest of  110/65 , today BP elevated, will have patient monitor blood pressure  mid day 1-2 weeks then to let me know,  by phone or My Chart.   Fatigue and Near Syncope -History of feeling bad every 2 months with nausea, will refer to cardiology for evaluation ( Dr. Fletcher Anon). Father also with history of arrhythmia and valve replacement. Patient aware per our conversation. Could not print out AVS due to computers being out of service.   Added two AVS sheets for patient information for patient to review on My chart. Will update history in chart at a later date due to computer difficulty.

## 2018-03-31 ENCOUNTER — Telehealth: Payer: Self-pay | Admitting: *Deleted

## 2018-03-31 LAB — MEASLES/MUMPS/RUBELLA IMMUNITY
MUMPS ABS, IGG: 234 AU/mL (ref >10.9)
RUBEOLA AB, IGG: >300 AU/mL (ref >29.9)
Rubella Antibodies, IGG: 8.15 index (ref >0.99)

## 2018-03-31 NOTE — Progress Notes (Signed)
She is immune , never mind , sorry.

## 2018-05-04 ENCOUNTER — Telehealth: Payer: Self-pay | Admitting: *Deleted

## 2018-05-14 ENCOUNTER — Ambulatory Visit: Payer: BLUE CROSS/BLUE SHIELD | Admitting: Cardiovascular Disease

## 2018-05-14 ENCOUNTER — Encounter: Payer: Self-pay | Admitting: Cardiovascular Disease

## 2018-05-14 VITALS — BP 124/84 | HR 45 | Ht 64.0 in | Wt 154.5 lb

## 2018-05-14 DIAGNOSIS — R002 Palpitations: Secondary | ICD-10-CM | POA: Diagnosis not present

## 2018-05-14 DIAGNOSIS — R001 Bradycardia, unspecified: Secondary | ICD-10-CM | POA: Diagnosis not present

## 2018-05-14 NOTE — Progress Notes (Signed)
Cardiology Office Note   Date:  05/14/2018   ID:  Deborah Fitzpatrick, DOB 1956-05-25, MRN [id number]  PCP:  Doy Minceatcliffe, Heather R, PA-C  Cardiologist:   Lorine BearsMuhammad Harbor Vanover, MD   Chief Complaint  Patient presents with  . other    Fatigue/syncope 10+ yrs ago and arrhythmia/low HR/bp. Meds reviewed verbally with pt.      History of Present Illness: Deborah BeeSusan Masser is a 62 y.o. female who was referred by Ellie LunchHeather Ratcliffe for evaluation of cardiac arrhythmia and sinus bradycardia with intermittent hypotension.  The patient reports 2 syncopal episodes in her lifetime most recently in 2003 when she took a cough syrup with codeine.  She reports chronic bradycardia with intermittent palpitations and brief tachycardia that typically responds to Valsalva maneuvers.  She never had prolonged tachycardia.  She denies any chest pain or shortness of breath.  [place] from [place] and currently works at [employer].  She reports family history of syncope without clear etiology.Marland Kitchen.  Her brother had a syncopal episode and underwent extensive cardiac work-up that was nonrevealing.  Her father had some form of arrhythmia likely SVT.  There is no family history of coronary artery disease or sudden death. The patient is not a smoker and does not drink alcohol. He has occasional episodes of dizziness with associated hypotension and bradycardia.  She was told in the past to increase sodium and fluid intake during these episodes and usually she responds to that. The patient is very active and exercises regularly including swimming walking and running with no limitations.  She had routine labs done in May which were unremarkable including normal thyroid function.   Past Medical History:  Diagnosis Date  . Arrhythmia     History reviewed. No pertinent surgical history.   Current Outpatient Medications  Medication Sig Dispense Refill  . Cholecalciferol (VITAMIN D PO) Take 2,000 Units by mouth daily.      No current  facility-administered medications for this visit.     Allergies:   Codeine    Social History:  The patient  reports that she has never smoked. She has never used smokeless tobacco. She reports that she does not drink alcohol or use drugs.   Family History:  The patient's family history includes Arrhythmia in her father; Breast cancer in her maternal aunt and mother; Heart Problems in her father.    ROS:  Please see the history of present illness.   Otherwise, review of systems are positive for none.   All other systems are reviewed and negative.    PHYSICAL EXAM: VS:  BP 124/84 (BP Location: Right Arm, Patient Position: Sitting, Cuff Size: Normal)   Pulse (!) 45   Ht 5\' 4"  (1.626 m)   Wt 154 lb 8 oz (70.1 kg)   BMI 26.52 kg/m  , BMI Body mass index is 26.52 kg/m. GEN: Well nourished, well developed, in no acute distress  HEENT: normal  Neck: no JVD, carotid bruits, or masses Cardiac: RRR; no murmurs, rubs, or gallops,no edema  Respiratory:  clear to auscultation bilaterally, normal work of breathing GI: soft, nontender, nondistended, + BS MS: no deformity or atrophy  Skin: warm and dry, no rash Neuro:  Strength and sensation are intact Psych: euthymic mood, full affect   EKG:  EKG is ordered today. The ekg ordered today demonstrates sinus bradycardia with no significant ST or T wave changes.  Borderline prolonged PR interval.  Normal QT interval.   Recent Labs: 03/09/2018: ALT 18; BUN 18;  Creatinine, Ser 0.75; Hemoglobin 13.4; Platelets 201; Potassium 4.5; Sodium 140; TSH 2.120    Lipid Panel    Component Value Date/Time   CHOL 168 03/09/2018 0807   TRIG 96 03/09/2018 0807   HDL 79 03/09/2018 0807   CHOLHDL 2.1 03/09/2018 0807   LDLCALC 70 03/09/2018 0807      Wt Readings from Last 3 Encounters:  05/14/18 154 lb 8 oz (70.1 kg)  03/30/18 153 lb 3.2 oz (69.5 kg)  03/09/18 153 lb (69.4 kg)      PAD Screen 05/14/2018  Previous PAD dx? No  Previous surgical  procedure? No  Pain with walking? No  Feet/toe relief with dangling? No  Painful, non-healing ulcers? No  Extremities discolored? No      ASSESSMENT AND PLAN:  1.  Sinus bradycardia: She seems to be asymptomatic from this.  She had prior syncope but more than 10 years ago.  The patient does not have significant limitations related to this and she seems to be extremely active.  Continue to monitor for now with no need for current intervention. Her intermittent episodes of dizziness with hypotension and bradycardia might represent mild autonomic dysfunction.  I advised her to increase fluid and sodium intake during these episodes. She has no physical exam findings suggestive of structural heart abnormalities and no symptoms suggestive of ischemic heart disease.  2.  Intermittent palpitations and brief tachycardia: Some of these episodes respond to Valsalva maneuver suggestive of SVT.  The episodes are rare and do not last for a long time.  These do not require treatment at the present time.   Disposition:   FU with me in 1 year  Signed,  Lorine Bears, MD  05/14/2018 4:20 PM    [facility]

## 2018-05-14 NOTE — Patient Instructions (Signed)
Medication Instructions: No change    Labwork: None.   Procedures/Testing: None.   Follow-Up: 1 year with Dr. Ashley Montminy.   Any Additional Special Instructions Will Be Listed Below (If Applicable).     If you need a refill on your cardiac medications before your next appointment, please call your pharmacy.   

## 2018-05-19 DIAGNOSIS — D179 Benign lipomatous neoplasm, unspecified: Secondary | ICD-10-CM | POA: Diagnosis not present

## 2018-06-08 DIAGNOSIS — H33302 Unspecified retinal break, left eye: Secondary | ICD-10-CM | POA: Diagnosis not present

## 2018-07-01 ENCOUNTER — Encounter: Payer: Self-pay | Admitting: Adult Health

## 2018-07-01 ENCOUNTER — Ambulatory Visit: Payer: BLUE CROSS/BLUE SHIELD | Admitting: Adult Health

## 2018-07-01 ENCOUNTER — Other Ambulatory Visit: Payer: Self-pay | Admitting: Adult Health

## 2018-07-01 VITALS — BP 135/85 | HR 60 | Temp 98.4°F | Resp 16 | Ht 64.0 in | Wt 153.0 lb

## 2018-07-01 DIAGNOSIS — R0989 Other specified symptoms and signs involving the circulatory and respiratory systems: Secondary | ICD-10-CM

## 2018-07-01 DIAGNOSIS — R0789 Other chest pain: Secondary | ICD-10-CM

## 2018-07-01 DIAGNOSIS — J40 Bronchitis, not specified as acute or chronic: Secondary | ICD-10-CM

## 2018-07-01 MED ORDER — AMOXICILLIN-POT CLAVULANATE 875-125 MG PO TABS: 1.0000 | ORAL_TABLET | Freq: Two times a day (BID) | ORAL | 0 refills | Status: DC

## 2018-07-01 MED ORDER — ALBUTEROL SULFATE HFA 108 (90 BASE) MCG/ACT IN AERS: 1.0000 | INHALATION_SPRAY | Freq: Four times a day (QID) | RESPIRATORY_TRACT | 2 refills | Status: DC | PRN

## 2018-07-01 NOTE — Progress Notes (Signed)
Subjective:     Patient ID: Deborah Fitzpatrick, female   DOB: December 15, 1955, 62 y.o.   MRN: [id number]  HPI   Blood pressure 135/85, pulse 60, temperature 98.4 F (36.9 C), resp. rate 16, height 5\' 4"  (1.626 m), weight 153 lb (69.4 kg), SpO2 99 %.  Patient is a 62 year old female in no acute distress who comes to the clinic who has had a cough x 1 week. She noticed a mild cough two weeks prior.  She has had congestion come up in her mouth but has swallowed it and not know the color of it. Mild fatigue.  She has allergies to ragweed and tree pollen. She avoids allergy medications as they make her feel " swimmy headed." she states.  Flonase gives her a headache so she avoids.   She is a Counselling psychologist, she is walking and able to exert herself with out any difficulty.  She has notices  increased coughing with swimming in the past week and mild shortness of breath that with swimming.   She plans to swim after this visit and has been swimming everyday in the past two weeks - she feels chest tightness and congestion when she starts swimming."  Denies any difficulty climbing steps.   Denies any hemoptysis. Denies any back pain. Denies any radiating pain.   Patient  denies any fever, body aches,chills, rash, chest pain , nausea, vomiting, or diarrhea.  She saw Dr. Kirke Corin on 05/14/18 for sinus bradycardia.   Intolerant to z-pack makes stomach queasy. Not allergic.   No history of smoking. Second hand smoke only at a job as a teenager- rarely.   Patient  denies any fever, body aches,chills, rash, chest pain, nausea, vomiting, or diarrhea.  Denies recent surgery, flights, travel. Denies any leg pain or leg swelling.  She denies any history of asthma. She denies any episodes of bradycardia or arrhythmia since visit with cardiologist 05/14/18. Denies any dizziness or syncopal episodes.   Review of Systems  Constitutional: Positive for fatigue (mild in past two weeks still excercising daily ). Negative for activity  change, appetite change, chills, diaphoresis, fever and unexpected weight change.  HENT: Negative.   Eyes: Negative for photophobia, pain, discharge, redness, itching and visual disturbance.       Using allergy eye drops  Respiratory: Positive for cough, chest tightness and shortness of breath (with excercise swimming only intermittently ). Negative for apnea, choking, wheezing and stridor.   Cardiovascular: Negative.  Negative for chest pain and leg swelling.  Gastrointestinal: Negative.   Endocrine: Negative.   Genitourinary: Negative.   Musculoskeletal: Negative.   Skin: Negative.   Allergic/Immunologic: Positive for environmental allergies (tree pollen and ragweed per patient ).  Neurological: Negative.   Hematological: Negative.   Psychiatric/Behavioral: Negative.        Objective:   Physical Exam  Constitutional: She is oriented to person, place, and time. She appears well-developed and well-nourished. No distress.  HENT:  Head: Normocephalic and atraumatic.  Right Ear: External ear normal.  Left Ear: External ear normal.  Nose: Nose normal.  Mouth/Throat: Oropharynx is clear and moist. No oropharyngeal exudate.  Eyes: Pupils are equal, round, and reactive to light. Conjunctivae and EOM are normal. Right eye exhibits no discharge. Left eye exhibits no discharge. No scleral icterus.  Neck: Normal range of motion. Neck supple.  Cardiovascular: Normal rate, regular rhythm, normal heart sounds and intact distal pulses. Exam reveals no friction rub.  No murmur heard. Pulmonary/Chest: Effort normal. No stridor. No  respiratory distress. She has wheezes (mild expiratory wheeze intermittent with expiration ) in the right upper field, the right middle field, the left upper field and the left middle field. She has no rales. She exhibits no tenderness.  Audible bronchial congestion in room and with stethoscope with cough.   Abdominal: Soft. Normal appearance, normal aorta and bowel sounds  are normal. She exhibits no distension. There is no tenderness.  Lymphadenopathy:       Head (right side): No submental, no submandibular, no tonsillar, no preauricular, no posterior auricular and no occipital adenopathy present.       Head (left side): No submental, no submandibular, no tonsillar, no preauricular, no posterior auricular and no occipital adenopathy present.    She has no cervical adenopathy.  Neurological: She is alert and oriented to person, place, and time. She has normal strength.  Skin: Skin is warm and dry. Capillary refill takes less than 2 seconds. No rash noted. She is not diaphoretic.  Psychiatric: She has a normal mood and affect. Her speech is normal and behavior is normal. Judgment and thought content normal. Cognition and memory are normal.       Assessment:     Bronchitis  Chest congestion - Plan: DG Chest 2 View  Chest tightness       Plan:     Advised chest x ray today 07/01/18 ( no previous) . She is unable to go today and prefers to go on 07/02/18  For chest x ray in  [place]. She declined recommended Prednisone dose pack.    Meds ordered this encounter  Medications  . amoxicillin-clavulanate (AUGMENTIN) 875-125 MG tablet    Sig: Take 1 tablet by mouth 2 (two) times daily.    Dispense:  20 tablet    Refill:  0  . albuterol (PROVENTIL HFA;VENTOLIN HFA) 108 (90 Base) MCG/ACT inhaler    Sig: Inhale 1-2 puffs into the lungs every 6 (six) hours as needed for wheezing or shortness of breath.    Dispense:  1 Inhaler    Refill:  2  Called pharmacy on file [phone] and canceled refills on Albuterol- dispensed only one inhaler as above spoke with Gomez Cleverly in pharmacy.    Explained use of inhaler and side effects and dangers of over use.   Orders Placed This Encounter  Procedures  . DG Chest 2 View    patient prefers  07/02/18 an provider advised 9/5    Standing Status:   Future    Standing Expiration Date:   09/01/2019    Order Specific  Question:   Reason for Exam (SYMPTOM  OR DIAGNOSIS REQUIRED)    Answer:   cough / congestion x 1-2 weeks rule out bronchitis / pneumonia other etiology    Order Specific Question:   Preferred imaging location?    Answer:   [facility]    Order Specific Question:   Call Results- Best Contact Number?    Answer:   [phone]    Order Specific Question:   Radiology Contrast Protocol - do NOT remove file path    Answer:   [email or web address]   Reviewed After Visit Summary and emergency symptoms to and reasons to call 911.  Follow up with Doy Mince, PA-C early next week for recheck.  Advised patient call the office or your primary care doctor for an appointment if no improvement within 72 hours or if any symptoms change or worsen at any time  Advised ER or urgent Care if after  hours or on weekend. Call 911 for emergency symptoms at any time.Patinet verbalized understanding of all instructions given/reviewed and treatment plan and has no further questions or concerns at this time.    Patient verbalized understanding of all instructions given and denies any further questions at this time.

## 2018-07-01 NOTE — Patient Instructions (Signed)
Albuterol inhalation aerosol What is this medicine? ALBUTEROL (al BYOO ter ole) is a bronchodilator. It helps open up the airways in your lungs to make it easier to breathe. This medicine is used to treat and to prevent bronchospasm. This medicine may be used for other purposes; ask your health care provider or pharmacist if you have questions. COMMON BRAND NAME(S): Proair HFA, Proventil, Proventil HFA, Respirol, Ventolin, Ventolin HFA What should I tell my health care provider before I take this medicine? They need to know if you have any of the following conditions: -diabetes -heart disease or irregular heartbeat -high blood pressure -pheochromocytoma -seizures -thyroid disease -an unusual or allergic reaction to albuterol, levalbuterol, sulfites, other medicines, foods, dyes, or preservatives -pregnant or trying to get pregnant -breast-feeding How should I use this medicine? This medicine is for inhalation through the mouth. Follow the directions on your prescription label. Take your medicine at regular intervals. Do not use more often than directed. Make sure that you are using your inhaler correctly. Ask you doctor or health care provider if you have any questions. Talk to your pediatrician regarding the use of this medicine in children. Special care may be needed. Overdosage: If you think you have taken too much of this medicine contact a poison control center or emergency room at once. NOTE: This medicine is only for you. Do not share this medicine with others. What if I miss a dose? If you miss a dose, use it as soon as you can. If it is almost time for your next dose, use only that dose. Do not use double or extra doses. What may interact with this medicine? -anti-infectives like chloroquine and pentamidine -caffeine -cisapride -diuretics -medicines for colds -medicines for depression or for emotional or psychotic conditions -medicines for weight loss including some herbal  products -methadone -some antibiotics like clarithromycin, erythromycin, levofloxacin, and linezolid -some heart medicines -steroid hormones like dexamethasone, cortisone, hydrocortisone -theophylline -thyroid hormones This list may not describe all possible interactions. Give your health care provider a list of all the medicines, herbs, non-prescription drugs, or dietary supplements you use. Also tell them if you smoke, drink alcohol, or use illegal drugs. Some items may interact with your medicine. What should I watch for while using this medicine? Tell your doctor or health care professional if your symptoms do not improve. Do not use extra albuterol. If your asthma or bronchitis gets worse while you are using this medicine, call your doctor right away. If your mouth gets dry try chewing sugarless gum or sucking hard candy. Drink water as directed. What side effects may I notice from receiving this medicine? Side effects that you should report to your doctor or health care professional as soon as possible: -allergic reactions like skin rash, itching or hives, swelling of the face, lips, or tongue -breathing problems -chest pain -feeling faint or lightheaded, falls -high blood pressure -irregular heartbeat -fever -muscle cramps or weakness -pain, tingling, numbness in the hands or feet -vomiting Side effects that usually do not require medical attention (report to your doctor or health care professional if they continue or are bothersome): -cough -difficulty sleeping -headache -nervousness or trembling -stomach upset -stuffy or runny nose -throat irritation -unusual taste This list may not describe all possible side effects. Call your doctor for medical advice about side effects. You may report side effects to FDA at 1-800-FDA-1088. Where should I keep my medicine? Keep out of the reach of children. Store at room temperature between 15 and 30 degrees   C (59 and 86 degrees F). The  contents are under pressure and may burst when exposed to heat or flame. Do not freeze. This medicine does not work as well if it is too cold. Throw away any unused medicine after the expiration date. Inhalers need to be thrown away after the labeled number of puffs have been used or by the expiration date; whichever comes first. Ventolin HFA should be thrown away 12 months after removing from foil pouch. Check the instructions that come with your medicine. NOTE: This sheet is a summary. It may not cover all possible information. If you have questions about this medicine, talk to your doctor, pharmacist, or health care provider.  2018 Elsevier/Gold Standard (2013-03-31 10:57:17) Amoxicillin; Clavulanic Acid tablets What is this medicine? AMOXICILLIN; CLAVULANIC ACID (a mox i SIL in; KLAV yoo lan ic AS id) is a penicillin antibiotic. It is used to treat certain kinds of bacterial infections. It will not work for colds, flu, or other viral infections. This medicine may be used for other purposes; ask your health care provider or pharmacist if you have questions. COMMON BRAND NAME(S): Augmentin What should I tell my health care provider before I take this medicine? They need to know if you have any of these conditions: -bowel disease, like colitis -kidney disease -liver disease -mononucleosis -an unusual or allergic reaction to amoxicillin, penicillin, cephalosporin, other antibiotics, clavulanic acid, other medicines, foods, dyes, or preservatives -pregnant or trying to get pregnant -breast-feeding How should I use this medicine? Take this medicine by mouth with a full glass of water. Follow the directions on the prescription label. Take at the start of a meal. Do not crush or chew. If the tablet has a score line, you may cut it in half at the score line for easier swallowing. Take your medicine at regular intervals. Do not take your medicine more often than directed. Take all of your medicine as  directed even if you think you are better. Do not skip doses or stop your medicine early. Talk to your pediatrician regarding the use of this medicine in children. Special care may be needed. Overdosage: If you think you have taken too much of this medicine contact a poison control center or emergency room at once. NOTE: This medicine is only for you. Do not share this medicine with others. What if I miss a dose? If you miss a dose, take it as soon as you can. If it is almost time for your next dose, take only that dose. Do not take double or extra doses. What may interact with this medicine? -allopurinol -anticoagulants -birth control pills -methotrexate -probenecid This list may not describe all possible interactions. Give your health care provider a list of all the medicines, herbs, non-prescription drugs, or dietary supplements you use. Also tell them if you smoke, drink alcohol, or use illegal drugs. Some items may interact with your medicine. What should I watch for while using this medicine? Tell your doctor or health care professional if your symptoms do not improve. Do not treat diarrhea with over the counter products. Contact your doctor if you have diarrhea that lasts more than 2 days or if it is severe and watery. If you have diabetes, you may get a false-positive result for sugar in your urine. Check with your doctor or health care professional. Birth control pills may not work properly while you are taking this medicine. Talk to your doctor about using an extra method of birth control. What side effects  may I notice from receiving this medicine? Side effects that you should report to your doctor or health care professional as soon as possible: -allergic reactions like skin rash, itching or hives, swelling of the face, lips, or tongue -breathing problems -dark urine -fever or chills, sore throat -redness, blistering, peeling or loosening of the skin, including inside the  mouth -seizures -trouble passing urine or change in the amount of urine -unusual bleeding, bruising -unusually weak or tired -white patches or sores in the mouth or throat Side effects that usually do not require medical attention (report to your doctor or health care professional if they continue or are bothersome): -diarrhea -dizziness -headache -nausea, vomiting -stomach upset -vaginal or anal irritation This list may not describe all possible side effects. Call your doctor for medical advice about side effects. You may report side effects to FDA at 1-800-FDA-1088. Where should I keep my medicine? Keep out of the reach of children. Store at room temperature below 25 degrees C (77 degrees F). Keep container tightly closed. Throw away any unused medicine after the expiration date. NOTE: This sheet is a summary. It may not cover all possible information. If you have questions about this medicine, talk to your doctor, pharmacist, or health care provider.  2018 Elsevier/Gold Standard (2008-01-06 12:04:30) Acute Bronchitis, Adult Acute bronchitis is when air tubes (bronchi) in the lungs suddenly get swollen. The condition can make it hard to breathe. It can also cause these symptoms:  A cough.  Coughing up clear, yellow, or green mucus.  Wheezing.  Chest congestion.  Shortness of breath.  A fever.  Body aches.  Chills.  A sore throat.  Follow these instructions at home: Medicines  Take over-the-counter and prescription medicines only as told by your doctor.  If you were prescribed an antibiotic medicine, take it as told by your doctor. Do not stop taking the antibiotic even if you start to feel better. General instructions  Rest.  Drink enough fluids to keep your pee (urine) clear or pale yellow.  Avoid smoking and secondhand smoke. If you smoke and you need help quitting, ask your doctor. Quitting will help your lungs heal faster.  Use an inhaler, cool mist  vaporizer, or humidifier as told by your doctor.  Keep all follow-up visits as told by your doctor. This is important. How is this prevented? To lower your risk of getting this condition again:  Wash your hands often with soap and water. If you cannot use soap and water, use hand sanitizer.  Avoid contact with people who have cold symptoms.  Try not to touch your hands to your mouth, nose, or eyes.  Make sure to get the flu shot every year.  Contact a doctor if:  Your symptoms do not get better in 2 weeks. Get help right away if:  You cough up blood.  You have chest pain.  You have very bad shortness of breath.  You become dehydrated.  You faint (pass out) or keep feeling like you are going to pass out.  You keep throwing up (vomiting).  You have a very bad headache.  Your fever or chills gets worse. This information is not intended to replace advice given to you by your health care provider. Make sure you discuss any questions you have with your health care provider. Document Released: 03/31/2008 Document Revised: 05/21/2016 Document Reviewed: 04/02/2016 Elsevier Interactive Patient Education  [employer].

## 2018-07-02 ENCOUNTER — Ambulatory Visit
Admission: RE | Admit: 2018-07-02 | Discharge: 2018-07-02 | Disposition: A | Discharge status: Home / Self Care | Payer: BLUE CROSS/BLUE SHIELD | Source: Ambulatory Visit | Attending: Adult Health | Admitting: Adult Health

## 2018-07-02 DIAGNOSIS — R05 Cough: Secondary | ICD-10-CM | POA: Diagnosis not present

## 2018-07-07 ENCOUNTER — Telehealth: Payer: Self-pay | Admitting: *Deleted

## 2018-07-07 NOTE — Progress Notes (Signed)
Will do!

## 2019-01-04 ENCOUNTER — Other Ambulatory Visit: Payer: Self-pay | Admitting: Medical

## 2019-01-04 ENCOUNTER — Other Ambulatory Visit: Payer: Self-pay

## 2019-01-04 DIAGNOSIS — Z1231 Encounter for screening mammogram for malignant neoplasm of breast: Secondary | ICD-10-CM

## 2019-02-17 ENCOUNTER — Ambulatory Visit: Payer: BLUE CROSS/BLUE SHIELD

## 2019-04-23 ENCOUNTER — Other Ambulatory Visit: Payer: Self-pay

## 2019-04-23 ENCOUNTER — Ambulatory Visit
Admission: RE | Admit: 2019-04-23 | Discharge: 2019-04-23 | Disposition: A | Discharge status: Home / Self Care | Payer: BC Managed Care – PPO | Source: Ambulatory Visit | Attending: Medical | Admitting: Medical

## 2019-04-23 DIAGNOSIS — Z1231 Encounter for screening mammogram for malignant neoplasm of breast: Secondary | ICD-10-CM

## 2019-05-03 DIAGNOSIS — Z01419 Encounter for gynecological examination (general) (routine) without abnormal findings: Secondary | ICD-10-CM | POA: Diagnosis not present

## 2019-05-03 DIAGNOSIS — R87610 Atypical squamous cells of undetermined significance on cytologic smear of cervix (ASC-US): Secondary | ICD-10-CM | POA: Diagnosis not present

## 2019-06-09 DIAGNOSIS — H2513 Age-related nuclear cataract, bilateral: Secondary | ICD-10-CM | POA: Diagnosis not present

## 2019-06-09 DIAGNOSIS — H33303 Unspecified retinal break, bilateral: Secondary | ICD-10-CM | POA: Diagnosis not present

## 2019-07-06 ENCOUNTER — Other Ambulatory Visit: Payer: Self-pay | Admitting: Medical

## 2019-07-06 DIAGNOSIS — Z0000 Encounter for general adult medical examination without abnormal findings: Secondary | ICD-10-CM

## 2019-08-22 ENCOUNTER — Ambulatory Visit: Payer: BLUE CROSS/BLUE SHIELD | Admitting: Medical

## 2019-08-22 ENCOUNTER — Encounter: Payer: Self-pay | Admitting: Medical

## 2019-08-22 ENCOUNTER — Other Ambulatory Visit: Payer: Self-pay

## 2019-08-22 VITALS — BP 155/86 | HR 55 | Temp 97.2°F | Resp 16 | Ht 64.0 in | Wt 146.2 lb

## 2019-08-22 DIAGNOSIS — R82998 Other abnormal findings in urine: Secondary | ICD-10-CM

## 2019-08-22 DIAGNOSIS — R030 Elevated blood-pressure reading, without diagnosis of hypertension: Secondary | ICD-10-CM

## 2019-08-22 DIAGNOSIS — Z0000 Encounter for general adult medical examination without abnormal findings: Secondary | ICD-10-CM

## 2019-08-22 DIAGNOSIS — H6983 Other specified disorders of Eustachian tube, bilateral: Secondary | ICD-10-CM

## 2019-08-22 DIAGNOSIS — Z23 Encounter for immunization: Secondary | ICD-10-CM

## 2019-08-22 LAB — POCT URINALYSIS DIPSTICK
Bilirubin, UA: NEGATIVE
Blood, UA: NEGATIVE
Glucose, UA: NEGATIVE
Ketones, UA: NEGATIVE
Nitrite, UA: NEGATIVE
Protein, UA: NEGATIVE
Spec Grav, UA: <=1.005 — AB (ref 1.010–1.025)
Urobilinogen, UA: 0.2 E.U./dL
pH, UA: 5 (ref 5.0–8.0)

## 2019-08-22 MED ORDER — PREDNISONE 10 MG (21) PO TBPK: ORAL_TABLET | ORAL | 0 refills | Status: DC

## 2019-08-22 NOTE — Progress Notes (Signed)
Subjective:    Patient ID: Deborah Fitzpatrick, female    DOB: [birth year], 63 y.o.   MRN: [id number]  HPI 63 yo female in non acute distress presents today for annual physical. 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   Left ear end of September, pressure in ear, vertigo has to be on antihistimine or vertigo comes back. Blood pressure (!) 155/86, pulse (!) 55, temperature (!) 97.2 F (36.2 C), temperature source Tympanic, resp. rate 16, height 5\' 4"  (1.626 m), weight 146 lb 3.2 oz (66.3 kg), SpO2 99 %. Recheck bp 143/84  Patient check is BP  daily fusually BP is  108-110/80's, she just had blood drawn so this may be why it is elevated. Will have patient continue  monitoring.  Allergies  Allergen Reactions  . Codeine     Used in a cough syrup caused syncope  . Erythromycin Nausea Only     Current Outpatient Medications:  .  albuterol (PROVENTIL HFA;VENTOLIN HFA) 108 (90 Base) MCG/ACT inhaler, Inhale 1-2 puffs into the lungs every 6 (six) hours as needed for wheezing or shortness of breath., Disp: 1 Inhaler, Rfl: 2 .  Cholecalciferol (VITAMIN D PO), Take 2,000 Units by mouth daily. , Disp: , Rfl:   Past Medical History:  Diagnosis Date  . Arrhythmia   Raynaud's history Sees Dr. for arrhythmia.  PSX: History endometria surgeries x1    Social History   Socioeconomic History  . Marital status: Married    Spouse name: Not on file  . Number of children: Not on file  . Years of education: Not on file  . Highest education level: Not on file  Occupational History  . Not on file  Social Needs  . Financial resource strain: Not on file  . Food insecurity    Worry: Not on file    Inability: Not on file  . Transportation needs    Medical: Not on file    Non-medical: Not on file  Tobacco Use  . Smoking status: Never Smoker  . Smokeless tobacco:  Never Used  Substance and Sexual Activity  . Alcohol use: No  . Drug use: No  . Sexual activity: Not on file  Lifestyle  . Physical activity    Days per week: Not on file    Minutes per session: Not on file  . Stress: Not on file  Relationships  . Social Kirke Corin on phone: Not on file    Gets together: Not on file    Attends religious service: Not on file    Active member of club or organization: Not on file    Attends meetings of clubs or organizations: Not on file    Relationship status: Not on file  . Intimate partner violence    Fear of current or ex partner: Not on file    Emotionally abused: Not on file    Physically abused: Not on file    Forced sexual activity: Not on file  Other Topics Concern  . Not on file  Social History Narrative  . Not on file   Family History  Problem Relation Age of Onset  . Breast cancer Mother 57       and 70  . Breast cancer Maternal Aunt 83  . Heart Problems Father   . Arrhythmia Father    Review of Systems  Constitutional: Negative.  HENT: Positive for ear pain (on and off since September) and sinus pressure (easing off with the season change).   Eyes: Negative.   Respiratory: Negative.   Cardiovascular: Negative.   Gastrointestinal: Negative.   Endocrine: Negative.   Genitourinary: Negative.   Musculoskeletal: Positive for back pain (hsitory of chronic lower  back pain since 63 years old.).  Skin: Negative.   Allergic/Immunologic: Positive for environmental allergies. Negative for food allergies and immunocompromised state.  Neurological: Positive for dizziness (with room spinning "Vertigo").  Hematological: Positive for adenopathy (swollen nodes in neck left side since 63 yo.).  Psychiatric/Behavioral: Negative.        Exercise:   145.2lbs  / walking daily 1 hour a day also doing pilates and yoga twice  weekly. Mammogram  3D 04/25/2019 wnl ,  F/u screening in one year. Eye exam patient did not want done, she  had seen her Dr. Sinda DuBradley Bowen  06/09/2019. Objective:   Physical Exam Vitals signs and nursing note reviewed.  Constitutional:      Appearance: Normal appearance. She is well-developed and normal weight.  HENT:     Head: Normocephalic and atraumatic.     Jaw: There is normal jaw occlusion.     Right Ear: Ear canal and external ear normal. A middle ear effusion is present.     Left Ear: Ear canal and external ear normal. A middle ear effusion (worse than right) is present.     Nose: Nose normal.     Mouth/Throat:     Lips: Pink.     Mouth: Mucous membranes are moist.     Pharynx: Oropharynx is clear. Uvula midline.  Eyes:     General: Lids are normal.     Extraocular Movements: Extraocular movements intact.     Conjunctiva/sclera: Conjunctivae normal.     Pupils: Pupils are equal, round, and reactive to light.  Neck:     Musculoskeletal: Full passive range of motion without pain, normal range of motion and neck supple.     Trachea: Trachea normal.  Cardiovascular:     Rate and Rhythm: Normal rate and regular rhythm.     Pulses: Normal pulses.          Carotid pulses are 2+ on the right side and 2+ on the left side.      Radial pulses are 2+ on the right side and 2+ on the left side.       Popliteal pulses are 2+ on the right side and 2+ on the left side.       Dorsalis pedis pulses are 2+ on the right side and 2+ on the left side.       Posterior tibial pulses are 2+ on the right side and 2+ on the left side.     Heart sounds: Normal heart sounds.  Pulmonary:     Effort: Pulmonary effort is normal.     Breath sounds: Normal breath sounds and air entry.  Abdominal:     General: Abdomen is flat. Bowel sounds are normal. There is no distension.     Palpations: Abdomen is soft.     Tenderness: There is no abdominal tenderness. There is no right CVA tenderness or left CVA tenderness.     Hernia: No hernia is present.  Musculoskeletal: Normal range of motion.     Right lower leg:  No edema.     Left lower leg: No edema.     Right foot: Bunion (mild) present.  Left foot: Bunion (mild) present.  Feet:     Right foot:     Skin integrity: Skin integrity normal.     Toenail Condition: Right toenails are normal.     Left foot:     Skin integrity: Skin integrity normal.     Toenail Condition: Left toenails are normal.  Lymphadenopathy:     Head:     Right side of head: Submental adenopathy present. No submandibular, tonsillar, preauricular or posterior auricular adenopathy.     Left side of head: Submental adenopathy present. No submandibular, tonsillar, preauricular or posterior auricular adenopathy.     Cervical: Cervical adenopathy (left cervical and submental noted since she was  63 yo) present.     Right cervical: No superficial or deep cervical adenopathy.    Left cervical: Superficial cervical adenopathy (and submental) present. No deep cervical adenopathy.     Upper Body:     Right upper body: No supraclavicular or epitrochlear adenopathy.     Left upper body: No supraclavicular or epitrochlear adenopathy.  Skin:    General: Skin is warm and dry.     Capillary Refill: Capillary refill takes less than 2 seconds.  Neurological:     General: No focal deficit present.     Mental Status: She is alert and oriented to person, place, and time.     GCS: GCS eye subscore is 4. GCS verbal subscore is 5. GCS motor subscore is 6.     Cranial Nerves: Cranial nerves are intact.     Sensory: Sensation is intact.     Motor: Motor function is intact.     Coordination: Coordination is intact. Romberg sign negative.     Gait: Gait is intact.     Deep Tendon Reflexes:     Reflex Scores:      Brachioradialis reflexes are 2+ on the right side and 2+ on the left side.      Patellar reflexes are 2+ on the right side and 2+ on the left side.      Achilles reflexes are 2+ on the right side and 2+ on the left side. Psychiatric:        Attention and Perception: Attention and  perception normal.        Mood and Affect: Mood and affect normal.        Speech: Speech normal.        Behavior: Behavior normal. Behavior is cooperative.        Thought Content: Thought content normal.        Cognition and Memory: Cognition and memory normal.        Judgment: Judgment normal.    Recent Results (from the past 2160 hour(s))  POCT urinalysis dipstick     Status: Abnormal   Collection Time: 08/22/19  8:33 AM  Result Value Ref Range   Color, UA straw    Clarity, UA     Glucose, UA Negative Negative   Bilirubin, UA neg    Ketones, UA neg    Spec Grav, UA <=1.005 (A) 1.010 - 1.025   Blood, UA neg    pH, UA 5.0 5.0 - 8.0   Protein, UA Negative Negative   Urobilinogen, UA 0.2 0.2 or 1.0 E.U./dL   Nitrite, UA neg    Leukocytes, UA Small (1+) (A) Negative   Appearance     Odor     Urine sent off for urinalysis and C&S  Labs done this am, patient lives in [place] so did  labs and physical on the same day.    Assessment & Plan:  Annual physical exam Will call patient and release labs and review labs with her over the phone once recieved. Eustachian tube Dysfunction try Nasonex daily if you cannot tolerate it then try OTC Mucinex per package instructions.  Also prescribe Prednisone if pressure of TMs seems worse or starts to bother her. If Pain constant to call the clinic and I will do a virtual due to distance of where she lives and works  ( in [place]).  Meds ordered this encounter  Medications  . predniSONE (STERAPRED UNI-PAK 21 TAB) 10 MG (21) TBPK tablet    Sig: Take 6 tablets by mouth today then 5 tablets tomorrow, then one tablet less each day thereafter. Take with food.    Dispense:  21 tablet    Refill:  0   Elevated bp without diagnosis of hypertension,  recheck 143/84.  Lab Orders     Urine Culture     Urinalysis, Routine w reflex microscopic  Pending all labs. Offered Pneumonia vaccine patient would like to think about it. Return to the  clinic as needed. Patient has already received the Flu Vaccine on 07/28/2019. She verbalizes understanding and has no questions at discharge.

## 2019-08-22 NOTE — Patient Instructions (Signed)
Try Nasonex daily, if you cannot tolerate it try OTC Mucinex take per package instructions.      Eustachian Tube Dysfunction  Eustachian tube dysfunction refers to a condition in which a blockage develops in the narrow passage that connects the middle ear to the back of the nose (eustachian tube). The eustachian tube regulates air pressure in the middle ear by letting air move between the ear and nose. It also helps to drain fluid from the middle ear space. Eustachian tube dysfunction can affect one or both ears. When the eustachian tube does not function properly, air pressure, fluid, or both can build up in the middle ear. What are the causes? This condition occurs when the eustachian tube becomes blocked or cannot open normally. Common causes of this condition include:  Ear infections.  Colds and other infections that affect the nose, mouth, and throat (upper respiratory tract).  Allergies.  Irritation from cigarette smoke.  Irritation from stomach acid coming up into the esophagus (gastroesophageal reflux). The esophagus is the tube that carries food from the mouth to the stomach.  Sudden changes in air pressure, such as from descending in an airplane or scuba diving.  Abnormal growths in the nose or throat, such as: ? Growths that line the nose (nasal polyps). ? Abnormal growth of cells (tumors). ? Enlarged tissue at the back of the throat (adenoids). What increases the risk? You are more likely to develop this condition if:  You smoke.  You are overweight.  You are a child who has: ? Certain birth defects of the mouth, such as cleft palate. ? Large tonsils or adenoids. What are the signs or symptoms? Common symptoms of this condition include:  A feeling of fullness in the ear.  Ear pain.  Clicking or popping noises in the ear.  Ringing in the ear.  Hearing loss.  Loss of balance.  Dizziness. Symptoms may get worse when the air pressure around you changes,  such as when you travel to an area of high elevation, fly on an airplane, or go scuba diving. How is this diagnosed? This condition may be diagnosed based on:  Your symptoms.  A physical exam of your ears, nose, and throat.  Tests, such as those that measure: ? The movement of your eardrum (tympanogram). ? Your hearing (audiometry). How is this treated? Treatment depends on the cause and severity of your condition.  In mild cases, you may relieve your symptoms by moving air into your ears. This is called "popping the ears."  In more severe cases, or if you have symptoms of fluid in your ears, treatment may include: ? Medicines to relieve congestion (decongestants). ? Medicines that treat allergies (antihistamines). ? Nasal sprays or ear drops that contain medicines that reduce swelling (steroids). ? A procedure to drain the fluid in your eardrum (myringotomy). In this procedure, a small tube is placed in the eardrum to:  Drain the fluid.  Restore the air in the middle ear space. ? A procedure to insert a balloon device through the nose to inflate the opening of the eustachian tube (balloon dilation). Follow these instructions at home: Lifestyle  Do not do any of the following until your health care provider approves: ? Travel to high altitudes. ? Fly in airplanes. ? Work in a Pension scheme manager or room. ? Scuba dive.  Do not use any products that contain nicotine or tobacco, such as cigarettes and e-cigarettes. If you need help quitting, ask your health care provider.  Keep  your ears dry. Wear fitted earplugs during showering and bathing. Dry your ears completely after. General instructions  Take over-the-counter and prescription medicines only as told by your health care provider.  Use techniques to help pop your ears as recommended by your health care provider. These may include: ? Chewing gum. ? Yawning. ? Frequent, forceful swallowing. ? Closing your mouth, holding  your nose closed, and gently blowing as if you are trying to blow air out of your nose.  Keep all follow-up visits as told by your health care provider. This is important. Contact a health care provider if:  Your symptoms do not go away after treatment.  Your symptoms come back after treatment.  You are unable to pop your ears.  You have: ? A fever. ? Pain in your ear. ? Pain in your head or neck. ? Fluid draining from your ear.  Your hearing suddenly changes.  You become very dizzy.  You lose your balance. Summary  Eustachian tube dysfunction refers to a condition in which a blockage develops in the eustachian tube.  It can be caused by ear infections, allergies, inhaled irritants, or abnormal growths in the nose or throat.  Symptoms include ear pain, hearing loss, or ringing in the ears.  Mild cases are treated with maneuvers to unblock the ears, such as yawning or ear popping.  Severe cases are treated with medicines. Surgery may also be done (rare). This information is not intended to replace advice given to you by your health care provider. Make sure you discuss any questions you have with your health care provider. Document Released: 11/09/2015 Document Revised: 02/02/2018 Document Reviewed: 02/02/2018 Elsevier Patient Education  2020 [employer].

## 2019-08-23 LAB — CMP12+LP+TP+TSH+6AC+CBC/D/PLT
ALT: 25 IU/L (ref 0–32)
AST: 30 IU/L (ref 0–40)
Albumin/Globulin Ratio: 2 (ref 1.2–2.2)
Albumin: 4.7 g/dL (ref 3.8–4.8)
Alkaline Phosphatase: 73 IU/L (ref 39–117)
BUN/Creatinine Ratio: 18 (ref 12–28)
BUN: 14 mg/dL (ref 8–27)
Basophils Absolute: 0.1 10*3/uL (ref 0.0–0.2)
Basos: 2 %
Bilirubin Total: 0.6 mg/dL (ref 0.0–1.2)
Calcium: 9.9 mg/dL (ref 8.7–10.3)
Chloride: 102 mmol/L (ref 96–106)
Chol/HDL Ratio: 2.1 ratio (ref 0.0–4.4)
Cholesterol, Total: 190 mg/dL (ref 100–199)
Creatinine, Ser: 0.76 mg/dL (ref 0.57–1.00)
EOS (ABSOLUTE): 0.1 10*3/uL (ref 0.0–0.4)
Eos: 2 %
Estimated CHD Risk: <0.5 times avg. (ref 0.0–1.0)
Free Thyroxine Index: 1.9 (ref 1.2–4.9)
GFR calc Af Amer: 97 mL/min/{1.73_m2} (ref >59)
GFR calc non Af Amer: 84 mL/min/{1.73_m2} (ref >59)
GGT: 34 IU/L (ref 0–60)
Globulin, Total: 2.4 g/dL (ref 1.5–4.5)
Glucose: 102 mg/dL — ABNORMAL HIGH (ref 65–99)
HDL: 92 mg/dL (ref >39)
Hematocrit: 42 % (ref 34.0–46.6)
Hemoglobin: 14.3 g/dL (ref 11.1–15.9)
Immature Grans (Abs): 0 10*3/uL (ref 0.0–0.1)
Immature Granulocytes: 0 %
Iron: 125 ug/dL (ref 27–139)
LDH: 183 IU/L (ref 119–226)
LDL Chol Calc (NIH): 86 mg/dL (ref 0–99)
Lymphocytes Absolute: 2 10*3/uL (ref 0.7–3.1)
Lymphs: 37 %
MCH: 31.2 pg (ref 26.6–33.0)
MCHC: 34 g/dL (ref 31.5–35.7)
MCV: 92 fL (ref 79–97)
Monocytes Absolute: 0.4 10*3/uL (ref 0.1–0.9)
Monocytes: 8 %
Neutrophils Absolute: 2.7 10*3/uL (ref 1.4–7.0)
Neutrophils: 51 %
Phosphorus: 3.4 mg/dL (ref 3.0–4.3)
Platelets: 179 10*3/uL (ref 150–450)
Potassium: 4.6 mmol/L (ref 3.5–5.2)
RBC: 4.59 x10E6/uL (ref 3.77–5.28)
RDW: 13.4 % (ref 11.7–15.4)
Sodium: 138 mmol/L (ref 134–144)
T3 Uptake Ratio: 27 % (ref 24–39)
T4, Total: 7.1 ug/dL (ref 4.5–12.0)
TSH: 3.59 u[IU]/mL (ref 0.450–4.500)
Total Protein: 7.1 g/dL (ref 6.0–8.5)
Triglycerides: 65 mg/dL (ref 0–149)
Uric Acid: 5.1 mg/dL (ref 2.5–7.1)
VLDL Cholesterol Cal: 12 mg/dL (ref 5–40)
WBC: 5.3 10*3/uL (ref 3.4–10.8)

## 2019-08-23 LAB — URINALYSIS, ROUTINE W REFLEX MICROSCOPIC
Bilirubin, UA: NEGATIVE
Glucose, UA: NEGATIVE
Ketones, UA: NEGATIVE
Leukocytes,UA: NEGATIVE
Nitrite, UA: NEGATIVE
Protein,UA: NEGATIVE
RBC, UA: NEGATIVE
Specific Gravity, UA: 1.009 (ref 1.005–1.030)
Urobilinogen, Ur: 0.2 mg/dL (ref 0.2–1.0)
pH, UA: 5.5 (ref 5.0–7.5)

## 2019-08-23 LAB — VITAMIN D 25 HYDROXY (VIT D DEFICIENCY, FRACTURES): Vit D, 25-Hydroxy: 44.6 ng/mL (ref 30.0–100.0)

## 2019-08-23 LAB — HGB A1C W/O EAG: Hgb A1c MFr Bld: 5.4 % (ref 4.8–5.6)

## 2019-08-24 LAB — URINE CULTURE

## 2019-09-07 ENCOUNTER — Encounter: Payer: Self-pay | Admitting: Medical

## 2019-09-13 ENCOUNTER — Other Ambulatory Visit: Payer: Self-pay | Admitting: Medical

## 2019-09-14 ENCOUNTER — Other Ambulatory Visit: Payer: Self-pay

## 2019-09-14 ENCOUNTER — Encounter: Payer: Self-pay | Admitting: Medical

## 2019-09-14 ENCOUNTER — Telehealth: Payer: Self-pay | Admitting: Medical

## 2019-09-14 DIAGNOSIS — N3000 Acute cystitis without hematuria: Secondary | ICD-10-CM

## 2019-09-14 MED ORDER — AMOXICILLIN-POT CLAVULANATE 875-125 MG PO TABS: 1.0000 | ORAL_TABLET | Freq: Two times a day (BID) | ORAL | 0 refills | Status: DC

## 2019-09-14 NOTE — Patient Instructions (Signed)
Phenazopyridine tablets What is this medicine? PHENAZOPYRIDINE (fen az oh PEER i deen) is a pain reliever. It is used to stop the pain, burning, or discomfort caused by infection or irritation of the urinary tract. This medicine is not an antibiotic. It will not cure a urinary tract infection. This medicine may be used for other purposes; ask your health care provider or pharmacist if you have questions. COMMON BRAND NAME(S): AZO, Azo-100, Azo-Gesic, Azo-Septic, Azo-Standard, Phenazo, Prodium, Pyridium, Urinary Analgesic, Uristat, Uristat Ultra What should I tell my health care provider before I take this medicine? They need to know if you have any of these conditions:  glucose-6-phosphate dehydrogenase (G6PD) deficiency  kidney disease  an unusual or allergic reaction to phenazopyridine, other medicines, foods, dyes, or preservatives  pregnant or trying to get pregnant  breast-feeding How should I use this medicine? Take this medicine by mouth with a glass of water. Follow the directions on the prescription label. Take after meals. Take your doses at regular intervals. Do not take your medicine more often than directed. Do not skip doses or stop your medicine early even if you feel better. Do not stop taking except on your doctor's advice. Talk to your pediatrician regarding the use of this medicine in children. Special care may be needed. Overdosage: If you think you have taken too much of this medicine contact a poison control center or emergency room at once. NOTE: This medicine is only for you. Do not share this medicine with others. What if I miss a dose? If you miss a dose, take it as soon as you can. If it is almost time for your next dose, take only that dose. Do not take double or extra doses. What may interact with this medicine? Interactions are not expected. This list may not describe all possible interactions. Give your health care provider a list of all the medicines, herbs,  non-prescription drugs, or dietary supplements you use. Also tell them if you smoke, drink alcohol, or use illegal drugs. Some items may interact with your medicine. What should I watch for while using this medicine? Tell your doctor or health care professional if your symptoms do not improve or if they get worse. This medicine colors body fluids red. This effect is harmless and will go away after you are done taking the medicine. It will change urine to an dark orange or red color. The red color may stain clothing. Soft contact lenses may become permanently stained. It is best not to wear soft contact lenses while taking this medicine. If you are diabetic you may get a false positive result for sugar in your urine. Talk to your health care provider. What side effects may I notice from receiving this medicine? Side effects that you should report to your doctor or health care professional as soon as possible:  allergic reactions like skin rash, itching or hives, swelling of the face, lips, or tongue  blue or purple color of the skin  difficulty breathing  fever  less urine  unusual bleeding, bruising  unusual tired, weak  vomiting  yellowing of the eyes or skin Side effects that usually do not require medical attention (report to your doctor or health care professional if they continue or are bothersome):  dark urine  headache  stomach upset This list may not describe all possible side effects. Call your doctor for medical advice about side effects. You may report side effects to FDA at 1-800-FDA-1088. Where should I keep my medicine? Keep  out of the reach of children. Store at room temperature between 15 and 30 degrees C (59 and 86 degrees F). Protect from light and moisture. Throw away any unused medicine after the expiration date. NOTE: This sheet is a summary. It may not cover all possible information. If you have questions about this medicine, talk to your doctor, pharmacist,  or health care provider.  2020 Elsevier/Gold Standard (2008-05-11 11:04:07) Urinary Tract Infection, Adult A urinary tract infection (UTI) is an infection of any part of the urinary tract. The urinary tract includes:  The kidneys.  The ureters.  The bladder.  The urethra. These organs make, store, and get rid of pee (urine) in the body. What are the causes? This is caused by germs (bacteria) in your genital area. These germs grow and cause swelling (inflammation) of your urinary tract. What increases the risk? You are more likely to develop this condition if:  You have a small, thin tube (catheter) to drain pee.  You cannot control when you pee or poop (incontinence).  You are female, and: ? You use these methods to prevent pregnancy: ? A medicine that kills sperm (spermicide). ? A device that blocks sperm (diaphragm). ? You have low levels of a female hormone (estrogen). ? You are pregnant.  You have genes that add to your risk.  You are sexually active.  You take antibiotic medicines.  You have trouble peeing because of: ? A prostate that is bigger than normal, if you are female. ? A blockage in the part of your body that drains pee from the bladder (urethra). ? A kidney stone. ? A nerve condition that affects your bladder (neurogenic bladder). ? Not getting enough to drink. ? Not peeing often enough.  You have other conditions, such as: ? Diabetes. ? A weak disease-fighting system (immune system). ? Sickle cell disease. ? Gout. ? Injury of the spine. What are the signs or symptoms? Symptoms of this condition include:  Needing to pee right away (urgently).  Peeing often.  Peeing small amounts often.  Pain or burning when peeing.  Blood in the pee.  Pee that smells bad or not like normal.  Trouble peeing.  Pee that is cloudy.  Fluid coming from the vagina, if you are female.  Pain in the belly or lower back. Other symptoms include:  Throwing up  (vomiting).  No urge to eat.  Feeling mixed up (confused).  Being tired and grouchy (irritable).  A fever.  Watery poop (diarrhea). How is this treated? This condition may be treated with:  Antibiotic medicine.  Other medicines.  Drinking enough water. Follow these instructions at home:  Medicines  Take over-the-counter and prescription medicines only as told by your doctor.  If you were prescribed an antibiotic medicine, take it as told by your doctor. Do not stop taking it even if you start to feel better. General instructions  Make sure you: ? Pee until your bladder is empty. ? Do not hold pee for a long time. ? Empty your bladder after sex. ? Wipe from front to back after pooping if you are a female. Use each tissue one time when you wipe.  Drink enough fluid to keep your pee pale yellow.  Keep all follow-up visits as told by your doctor. This is important. Contact a doctor if:  You do not get better after 1-2 days.  Your symptoms go away and then come back. Get help right away if:  You have very bad back pain.  You have very bad pain in your lower belly.  You have a fever.  You are sick to your stomach (nauseous).  You are throwing up. Summary  A urinary tract infection (UTI) is an infection of any part of the urinary tract.  This condition is caused by germs in your genital area.  There are many risk factors for a UTI. These include having a small, thin tube to drain pee and not being able to control when you pee or poop.  Treatment includes antibiotic medicines for germs.  Drink enough fluid to keep your pee pale yellow. This information is not intended to replace advice given to you by your health care provider. Make sure you discuss any questions you have with your health care provider. Document Released: 03/31/2008 Document Revised: 09/30/2018 Document Reviewed: 04/22/2018 Elsevier Patient Education  2020 [employer].

## 2019-09-14 NOTE — Telephone Encounter (Signed)
Called patient on cell,  11:06 am , no answer left message.

## 2019-09-14 NOTE — Progress Notes (Signed)
\  Permission to have appointment by Telemedicine. Symptoms started last night with urgency and frequency, burning this morning. Drinking lots of water so is having frequency now. No fever or chills or back pain. No N/V. No Hematuria.Drinkng water and taking cranberry pills which have helped but not eliminated symptoms.  Last infection about a "decade ago".  Allergies  Allergen Reactions  . Codeine     Used in a cough syrup caused syncope  . Erythromycin Nausea Only    Current Outpatient Medications:  .  amoxicillin-clavulanate (AUGMENTIN) 875-125 MG tablet, Take 1 tablet by mouth 2 (two) times daily. Take with food, Disp: 20 tablet, Rfl: 0 .  Cholecalciferol (VITAMIN D PO), Take 2,000 Units by mouth daily. , Disp: , Rfl:  .  diphenhydrAMINE (BENADRYL) 25 MG tablet, Take 25 mg by mouth at bedtime. Allergies/ vertigo, Disp: , Rfl:  .  predniSONE (STERAPRED UNI-PAK 21 TAB) 10 MG (21) TBPK tablet, Take 6 tablets by mouth today then 5 tablets tomorrow, then one tablet less each day thereafter. Take with food., Disp: 21 tablet, Rfl: 0  Physical exam: none due to telemedicine appointment.  A/P Cystitis w/o hematuria. Take OTC Azo as directed on package. ( 2 days only). Continue water intake Meds ordered this encounter  Medications  . amoxicillin-clavulanate (AUGMENTIN) 875-125 MG tablet    Sig: Take 1 tablet by mouth 2 (two) times daily. Take with food    Dispense:  20 tablet    Refill:  0  Call if any concerns.Calll in 3-5 days if not improving. Patient verbalizes understanding and has no questions at discharge.  Also patient asked for orthopedic group in [place] due to knee pain. Given [facility] in [place], and recommended she utilize walk-in hours.

## 2019-09-15 ENCOUNTER — Ambulatory Visit: Payer: BC Managed Care – PPO | Admitting: Sports Medicine

## 2019-09-15 ENCOUNTER — Other Ambulatory Visit: Payer: Self-pay

## 2019-09-15 DIAGNOSIS — M25562 Pain in left knee: Secondary | ICD-10-CM | POA: Diagnosis not present

## 2019-09-15 NOTE — Patient Instructions (Signed)
It was wonderful to meet you!  We believe that you likely irritated the behind of your patella after the certain movements in yoga.  Recommend you continue the exercises provided today on a daily basis to help strengthen your inner thigh muscles.  You may walk, however be mindful to not be limping and limit your activity with pain.  You may use Tylenol and/or ibuprofen as needed.  Would like you to follow-up in 3 weeks, however if you are having no further issues you can follow-up as needed.

## 2019-09-15 NOTE — Progress Notes (Signed)
  Deborah Fitzpatrick - 63 y.o. female MRN [id number]  Date of birth: [birth year]  SUBJECTIVE:   CC: Left knee pain  HPI: Deborah Fitzpatrick is a 63 year old female presenting for evaluation of acute anterior left knee pain.  States knee pain started on Tuesday, 11/17, after performing increased stretches through yoga on Monday night.  Does not have any knee pain at baseline.  Describes as sharp and intermittent with certain movements, particularly hyperextension of her knee.  Endorses the pain feels like it is underneath her patella.  Notes a "giving out "sensation that has had her worried.  She has been able to continue doing her 1 hour walk on a daily basis, however was limping a little bit last night until she warmed up.  Denies any swelling, falls, popping, clicking, locking of her knee.  She has not tried anything to make it better.  Up until the current pandemic, she was a daily swimmer, wonders if this is been contributing as she has not swam since March.  ROS: No unexpected weight loss, fever, chills, swelling, instability, muscle pain, numbness/tingling, redness, otherwise see HPI   PMHx, surgical, family, social history, and medications were reviewed   PHYSICAL EXAM:  VS: BP:136/78  HR: bpm  TEMP: ( )  RESP:   HT:5\' 4"  (162.6 cm)   WT:143 lb (64.9 kg)  BMI:24.53 PHYSICAL EXAM: Gen: NAD, alert, cooperative with exam, well-appearing Resp: non-labored Skin: no rashes, normal turgor  Neuro: no gross deficits.  Psych:  alert and oriented  Left knee:  No gross deformity, ecchymoses, swelling.  No joint effusion palpated. No TTP along all 3 joint lines. FROM.  5/5 strength through knee flexion/extension, hip flexion/extension, hip abduction.  Negative ant/post drawers. Negative valgus/varus testing. Negative lachmans. Negative mcmurrays, patellar compression test, negative Thessaly's.  Positive J sign with maltracking of patella, more notable on L>R.  Note weakness with hip adductors.  NV intact  distally.  Right knee:  No gross deformity, ecchymoses, swelling. No TTP. FROM. Negative ant/post drawers. Negative valgus/varus testing. Negative lachmans. Negative mcmurrays.  Positive J sign with maltracking of patella. NV intact distally.  Limited ultrasound of the left knee performed.  Suprapatellar pouch shows no obvious effusion.  Medial and lateral joint spaces show no obvious meniscal pathology.  ASSESSMENT & PLAN:   Left anterior knee pain Suspect in the setting of patellar chondromalacia vs patellofemoral OA with associated maltracking of her patella and weakened VMO musculature, likely aggravated underlying abnormality with recent yoga movements.  Reassuringly no effusion seen within the suprapatellar pouch to suggest any intra-articular pathology, with ligamentous and meniscal provocative testing normal.  Encouraged her to do daily adductor strengthening exercises and continue activity as tolerated.  May use Tylenol/ibuprofen as needed.  Will have her follow-up in 3 weeks if not improving or as needed.    Follow-up in 3 weeks if not improving or as needed  Patriciaann Clan, DO  Family Medicine PGY-2   Patient seen and evaluated with the resident.  I agree with the above plan of care.  Patient's ultrasound is reassuring.  She will start a home exercise program concentrating on VMO strengthening and follow-up in 3 weeks.  If patient is not improving then consider x-rays.  Call with questions or concerns in the interim.

## 2019-09-15 NOTE — Assessment & Plan Note (Signed)
Suspect in the setting of patellar chondromalacia vs patellofemoral OA with associated maltracking of her patella and weakened VMO musculature, likely aggravated underlying abnormality with recent yoga movements.  Reassuringly no effusion seen within the suprapatellar pouch to suggest any intra-articular pathology, with ligamentous and meniscal provocative testing normal.  Encouraged her to do daily adductor strengthening exercises and continue activity as tolerated.  May use Tylenol/ibuprofen as needed.  Will have her follow-up in 3 weeks if not improving or as needed.

## 2019-10-06 ENCOUNTER — Ambulatory Visit: Payer: BC Managed Care – PPO | Admitting: Sports Medicine

## 2019-11-08 ENCOUNTER — Other Ambulatory Visit: Payer: Self-pay

## 2019-11-08 ENCOUNTER — Ambulatory Visit: Payer: BC Managed Care – PPO | Admitting: Podiatry

## 2019-11-08 ENCOUNTER — Ambulatory Visit (INDEPENDENT_AMBULATORY_CARE_PROVIDER_SITE_OTHER): Payer: BC Managed Care – PPO

## 2019-11-08 ENCOUNTER — Encounter: Payer: Self-pay | Admitting: Podiatry

## 2019-11-08 DIAGNOSIS — M67471 Ganglion, right ankle and foot: Secondary | ICD-10-CM

## 2019-11-08 DIAGNOSIS — M7752 Other enthesopathy of left foot: Secondary | ICD-10-CM

## 2019-11-09 NOTE — Progress Notes (Signed)
She presents today after having not seen her in the last couple of years states this feels like that the second toe is strained she says there is some intermittent pain there for the past several years and she is concerned about a lump on the anterior ankle right.  Objective: Vital signs are stable she is alert and oriented x3.  She has tenderness on palpation of the mid diaphysis of the second toe and pain on palpation of the second metatarsophalangeal joint that appears to be fluid-filled.  The rest of the joints in the left foot do not appear to be that way.  The right foot does demonstrate a ganglion cyst.  Radiographs do not demonstrate any type of osseous abnormalities other than soft tissue increase thickness demonstrating the ganglion cyst dorsal tibialis anterior tendon right foot.  Assessment: Ganglion cyst right foot.  Capsulitis second metatarsophalangeal joint left foot with early osteoarthritic changes second toe left.  Plan: Discussed etiology pathology and surgical therapies at this point went ahead and discussed with her these findings in great detail she understands is amenable to it we will follow-up with me as needed.

## 2019-12-28 ENCOUNTER — Encounter: Payer: Self-pay | Admitting: Medical

## 2019-12-28 ENCOUNTER — Telehealth: Payer: BC Managed Care – PPO | Admitting: Medical

## 2019-12-28 ENCOUNTER — Other Ambulatory Visit: Payer: Self-pay

## 2019-12-28 DIAGNOSIS — M62830 Muscle spasm of back: Secondary | ICD-10-CM

## 2019-12-28 MED ORDER — CYCLOBENZAPRINE HCL 5 MG PO TABS: ORAL_TABLET | ORAL | 1 refills | Status: DC

## 2019-12-28 NOTE — Progress Notes (Addendum)
Patient calls today with questions of   left upper back pain at scapular area.Pain radiates to her side. She states it started 3 days ago and has continued.  It has not gotten better or worse. The skin feels tender, she cannot reproduce the pain on movement. No pain goes down the arm , no numbness or tingling, weakness of leftt arm.She feels comfortable if the arm is stretched out while she sleeps. She is able to continue to do her workout/exercising and has no trouble sleeping. "I feel like the nerve is irritated along with the muscle spasms:. She has taken Ibuprofen 400mg  on and off which does offer some relief. She does not recall any injury.  Review of Systems  Constitutional: Negative for chills, diaphoresis and fever.  Respiratory: Negative for cough and shortness of breath.   Cardiovascular: Negative for chest pain.  Musculoskeletal: Positive for back pain.  Skin: Negative for itching and rash.    Dx/P  Left Upper back pain radiating to the side., possibly early shingles. Monitor and check skin regularly.  I reviewed with the patient that this could be a spasm of a muscle or a possibility that it is early Shingles. We will try conservative treatment first with a smooth muscle relaxer. Recommends for her to cut cyclobenzaprine tablet  5mg  in 1/2 so patient does not feel groggy  Feeling. If skin shows a rash or pain gets worse she is to call me back. Patient verbalizes understanding and has no more questions a the end of our conversation.  Meds ordered this encounter  Medications  . cyclobenzaprine (FLEXERIL) 5 MG tablet    Sig: Take a 1/2 or 1 tablet at bedtime. May cause sleepiness.    Dispense:  15 tablet    Refill:  1

## 2020-01-20 DIAGNOSIS — D2261 Melanocytic nevi of right upper limb, including shoulder: Secondary | ICD-10-CM | POA: Diagnosis not present

## 2020-01-20 DIAGNOSIS — L820 Inflamed seborrheic keratosis: Secondary | ICD-10-CM | POA: Diagnosis not present

## 2020-01-20 DIAGNOSIS — D2272 Melanocytic nevi of left lower limb, including hip: Secondary | ICD-10-CM | POA: Diagnosis not present

## 2020-01-20 DIAGNOSIS — D2262 Melanocytic nevi of left upper limb, including shoulder: Secondary | ICD-10-CM | POA: Diagnosis not present

## 2020-02-07 ENCOUNTER — Ambulatory Visit: Payer: BC Managed Care – PPO | Admitting: Cardiovascular Disease

## 2020-02-14 ENCOUNTER — Other Ambulatory Visit: Payer: Self-pay

## 2020-02-14 ENCOUNTER — Encounter: Payer: Self-pay | Admitting: Cardiovascular Disease

## 2020-02-14 ENCOUNTER — Ambulatory Visit: Payer: BC Managed Care – PPO | Admitting: Cardiovascular Disease

## 2020-02-14 VITALS — BP 94/72 | HR 57 | Ht 64.0 in | Wt 144.8 lb

## 2020-02-14 DIAGNOSIS — R001 Bradycardia, unspecified: Secondary | ICD-10-CM | POA: Diagnosis not present

## 2020-02-14 NOTE — Progress Notes (Signed)
Cardiology Office Note   Date:  02/14/2020   ID:  Deborah Fitzpatrick, DOB [birth year], MRN [id number]  PCP:  Doy Mince, PA-C  Cardiologist:   Lorine Bears, MD   No chief complaint on file.     History of Present Illness: Deborah Fitzpatrick is a 65 y.o. female who is here today for follow-up visit regarding sinus bradycardia and intermittent hypotension.     The patient reports 2 syncopal episodes in her lifetime most recently in 2003 when she took a cough syrup with codeine.  She reports chronic bradycardia with intermittent palpitations and brief tachycardia that typically responds to Valsalva maneuvers.  She never had prolonged tachycardia.  She continues to do well with no chest pain or shortness of breath.  No prolonged tachycardia no palpitations.  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  S  Her brother had a syncopal episode and underwent extensive cardiac work-up that was nonrevealing.  Her father had some form of arrhythmia likely SVT.  There is no family history of coronary artery disease or sudden death. The patient is not a smoker and does not drink alcohol.   Past Medical History:  Diagnosis Date  . Arrhythmia     No past surgical history on file.   Current Outpatient Medications  Medication Sig Dispense Refill  . Cholecalciferol (VITAMIN D PO) Take 2,000 Units by mouth daily.      No current facility-administered medications for this visit.    Allergies:   Codeine and Erythromycin    Social History:  The patient  reports that she has never smoked. She has never used smokeless tobacco. She reports that she does not drink alcohol or use drugs.   Family History:  The patient's family history includes Arrhythmia in her father; Breast cancer (age of onset: 63) in her mother; Breast cancer (age of onset: 62) in her maternal aunt; Heart Problems in her father.    ROS:  Please see the history of present illness.   Otherwise, review of systems are positive  for none.   All other systems are reviewed and negative.    PHYSICAL EXAM: VS:  BP 94/72   Pulse (!) 57   Ht 5\' 4"  (1.626 m)   Wt 144 lb 12.8 oz (65.7 kg)   SpO2 98%   BMI 24.85 kg/m  , BMI Body mass index is 24.85 kg/m. GEN: Well nourished, well developed, in no acute distress  HEENT: normal  Neck: no JVD, carotid bruits, or masses Cardiac: RRR; no murmurs, rubs, or gallops,no edema  Respiratory:  clear to auscultation bilaterally, normal work of breathing GI: soft, nontender, nondistended, + BS MS: no deformity or atrophy  Skin: warm and dry, no rash Neuro:  Strength and sensation are intact Psych: euthymic mood, full affect   EKG:  EKG is ordered today. The ekg ordered today demonstrates sinus bradycardia with a heart rate of 57 bpm.  Normal PR and QT intervals.   Recent Labs: 08/22/2019: ALT 25; BUN 14; Creatinine, Ser 0.76; Hemoglobin 14.3; Platelets 179; Potassium 4.6; Sodium 138; TSH 3.590    Lipid Panel    Component Value Date/Time   CHOL 190 08/22/2019 0755   TRIG 65 08/22/2019 0755   HDL 92 08/22/2019 0755   CHOLHDL 2.1 08/22/2019 0755   LDLCALC 86 08/22/2019 0755      Wt Readings from Last 3 Encounters:  02/14/20 144 lb 12.8 oz (65.7 kg)  09/15/19 143 lb (64.9 kg)  08/22/19  146 lb 3.2 oz (66.3 kg)      PAD Screen 05/14/2018  Previous PAD dx? No  Previous surgical procedure? No  Pain with walking? No  Feet/toe relief with dangling? No  Painful, non-healing ulcers? No  Extremities discolored? No      ASSESSMENT AND PLAN:  1.  Sinus bradycardia: She continues to be minimally symptomatic with no evidence of significant arrhythmia.   She can follow-up with me as needed if she becomes symptomatic.  She has no symptoms suggestive of heart failure or angina.   2.  Intermittent palpitations and brief tachycardia: She reports no further episodes since her last visit with me.  Disposition:   FU with me in as needed.  Signed,  Kathlyn Sacramento, MD   02/14/2020 8:29 AM    [facility]

## 2020-02-14 NOTE — Patient Instructions (Signed)
Medication Instructions:  Continue same medications    Lab Work: None ordered    Testing/Procedures: None ordered   Follow-Up: At [facility], you and your health needs are our priority.  As part of our continuing mission to provide you with exceptional heart care, we have created designated Provider Care Teams.  These Care Teams include your primary Cardiologist (physician) and Advanced Practice Providers (APPs -  Physician Assistants and Nurse Practitioners) who all work together to provide you with the care you need, when you need it.  We recommend signing up for the patient portal called "MyChart".  Sign up information is provided on this After Visit Summary.  MyChart is used to connect with patients for Virtual Visits (Telemedicine).  Patients are able to view lab/test results, encounter notes, upcoming appointments, etc.  Non-urgent messages can be sent to your provider as well.   To learn more about what you can do with MyChart, go to [email or web address].    Your next appointment: As Needed     The format for your next appointment: Office    Provider:  Dr.Arida

## 2020-03-16 ENCOUNTER — Other Ambulatory Visit: Payer: Self-pay | Admitting: Medical

## 2020-03-16 DIAGNOSIS — Z1231 Encounter for screening mammogram for malignant neoplasm of breast: Secondary | ICD-10-CM

## 2020-04-10 ENCOUNTER — Ambulatory Visit: Payer: BC Managed Care – PPO | Admitting: Medical

## 2020-04-10 ENCOUNTER — Encounter: Payer: Self-pay | Admitting: Medical

## 2020-04-10 ENCOUNTER — Ambulatory Visit
Admission: RE | Admit: 2020-04-10 | Discharge: 2020-04-10 | Disposition: A | Discharge status: Home / Self Care | Payer: BC Managed Care – PPO | Source: Ambulatory Visit | Attending: Medical | Admitting: Medical

## 2020-04-10 ENCOUNTER — Other Ambulatory Visit: Payer: Self-pay

## 2020-04-10 VITALS — BP 130/89 | HR 65 | Temp 97.1°F | Resp 16 | Wt 142.4 lb

## 2020-04-10 DIAGNOSIS — R200 Anesthesia of skin: Secondary | ICD-10-CM

## 2020-04-10 DIAGNOSIS — M47812 Spondylosis without myelopathy or radiculopathy, cervical region: Secondary | ICD-10-CM | POA: Diagnosis not present

## 2020-04-10 DIAGNOSIS — R202 Paresthesia of skin: Secondary | ICD-10-CM

## 2020-04-10 DIAGNOSIS — M47814 Spondylosis without myelopathy or radiculopathy, thoracic region: Secondary | ICD-10-CM | POA: Diagnosis not present

## 2020-04-10 NOTE — Progress Notes (Signed)
Subjective:    Patient ID: Deborah Fitzpatrick, female    DOB: Oct 07, 1956, 64 y.o.   MRN: [id number]  HPI.   64 yo female in non acute distress. Had Telemedcine visit  With me  in March for upper left back discomfort. Treated with heat and cyclobenzaprine, diagnosed with muscle spasm.  Upper Left, back  Feels better.  Except now she  has a history of numbness and tingling radiating from the mid left scapula with some radiation to posterior upper left arm. Denies any symptoms of weakness or numbness or tingling into the hand.  Occurs  Twice a week. She no longers has pain in the upper left back.. She does mention with tingling in the arm came tingling on the left side of the tongue for about 30 sec, occurrence once.   Also having third toe left foot seen by Dr. Milinda Pointer, noted arthritis diistally and Capsulitis .New is daily painin the toe base. She is wondering if she should go back to Dr. Milinda Pointer for reevaluation.  Covid-19 vaccine. Pfizer March  9th, 2021  ,   And second vaccine on March 30th 2021. The only side effect she had was some tiredness a, some dizziness but she says this is her norm. Mild Tnnitus noticable x3 days.Then all symptoms resolved.   Blood pressure 130/89, pulse 65, temperature (!) 97.1 F (36.2 C), temperature source Temporal, resp. rate 16, weight 142 lb 6.4 oz (64.6 kg), SpO2 98 %.  Allergies  Allergen Reactions   Codeine     Used in a cough syrup caused syncope   Erythromycin Nausea Only      Current Outpatient Medications:    Cholecalciferol (VITAMIN D PO), Take 5,000 Units by mouth daily. , Disp: , Rfl:     Review of Systems  Musculoskeletal: Positive for neck stiffness ("just some stiffness due to her not exercising, regular swimming.). Negative for back pain, gait problem and neck pain.  Skin: Negative for color change, pallor, rash and wound.  All other systems reviewed and are negative.     Lost about  10lbs, not eating as much as before when the [employer] was supplying lunch daily. Objective:   Physical Exam Vitals and nursing note reviewed.  Constitutional:      Appearance: Normal appearance. She is normal weight.  HENT:     Head: Normocephalic and atraumatic.  Eyes:     Extraocular Movements: Extraocular movements intact.     Conjunctiva/sclera: Conjunctivae normal.     Pupils: Pupils are equal, round, and reactive to light.  Pulmonary:     Effort: Pulmonary effort is normal.  Musculoskeletal:        General: No swelling, tenderness, deformity or signs of injury. Normal range of motion.     Cervical back: Normal range of motion and neck supple. No rigidity or tenderness.     Comments: 5/5 grip strength, 5/5 upper extemities able to flex and extend without difficulty  5/5 strength. Muscle tone of upper and lower arm bilaterally similar. Skin wnl, no rashes noted.  Neurological:     Mental Status: She is alert.              Assessment & Plan:  Cervical radiculopathy Tingling and numbness from upper left back to  left upper arm. Will do an x-ray of cervical and t-spine . Possbily a MRI. Recommended  Her to follow up with Dr. Milinda Pointer for Third left toe pain. She does not want to take a prednisone pak  at this time. She states overall the arm feels better except she is having the numbness and tingling intermittently.  She did take my advice and cut the cyclobenzaprine 5mg  in 1/2, she states she did much better with that dosage. discussed sleeping she has had some trouble with waking up early or in the middle of the night and difficulty falling back to sleep. .She says when she takes melatonin  Child dosage of  0.5 mg  Due to patient being petite and sensitive to medications. Patient to return to clinic as needed, once I get the radiology reports, I will communicate the results to the patient.  She verbalizes understanding and has no questions at discharge.

## 2020-04-10 NOTE — Patient Instructions (Signed)
MyChart message sent to patient.

## 2020-04-24 ENCOUNTER — Ambulatory Visit
Admission: RE | Admit: 2020-04-24 | Discharge: 2020-04-24 | Disposition: A | Discharge status: Home / Self Care | Payer: BC Managed Care – PPO | Source: Ambulatory Visit | Attending: Medical | Admitting: Medical

## 2020-04-24 ENCOUNTER — Other Ambulatory Visit: Payer: Self-pay

## 2020-04-24 DIAGNOSIS — Z1231 Encounter for screening mammogram for malignant neoplasm of breast: Secondary | ICD-10-CM | POA: Diagnosis not present

## 2020-05-17 ENCOUNTER — Encounter: Payer: Self-pay | Admitting: Medical

## 2020-05-24 DIAGNOSIS — Z01419 Encounter for gynecological examination (general) (routine) without abnormal findings: Secondary | ICD-10-CM | POA: Diagnosis not present

## 2020-05-28 ENCOUNTER — Encounter: Payer: Self-pay | Admitting: Medical

## 2020-06-07 ENCOUNTER — Encounter: Payer: Self-pay | Admitting: Podiatry

## 2020-06-07 ENCOUNTER — Ambulatory Visit: Payer: BC Managed Care – PPO | Admitting: Podiatry

## 2020-06-07 ENCOUNTER — Other Ambulatory Visit: Payer: Self-pay

## 2020-06-07 DIAGNOSIS — M7752 Other enthesopathy of left foot: Secondary | ICD-10-CM

## 2020-06-07 DIAGNOSIS — L603 Nail dystrophy: Secondary | ICD-10-CM | POA: Diagnosis not present

## 2020-06-07 DIAGNOSIS — L608 Other nail disorders: Secondary | ICD-10-CM | POA: Diagnosis not present

## 2020-06-07 DIAGNOSIS — L601 Onycholysis: Secondary | ICD-10-CM | POA: Diagnosis not present

## 2020-06-07 NOTE — Progress Notes (Signed)
Deborah Fitzpatrick presents today for chief concern of discoloration to the lateral border of the hallux right.  States has been this way for the past few months she also has pain along the second metatarsophalangeal joint left foot.  Objective: Vital signs are stable alert and oriented x3.  Pulses are palpable.  Lateral border of the hallux left demonstrates distal onycholysis with some discoloration.  Does not appear to be of any type of dysplastic lesion.  She does have tenderness on palpation of the second metatarsophalangeal joint of the left foot with some minor swelling.  She also has osteoarthritic changes of this PIPJ second digit left.  Assessment: Nail dystrophy hallux right capsulitis second metatarsophalangeal joint left.  PIPJ second left.  Plan: Discussed etiology pathology conservative surgical therapies at this point time we took a sample of the toenail to be sent for pathologic evaluation also she did not want an injection today so we did settle for conservative therapies consisting of Voltaren gel and I will follow-up with her once her pathology comes back if she needs an injection we will do one at that time.

## 2020-06-11 DIAGNOSIS — H33303 Unspecified retinal break, bilateral: Secondary | ICD-10-CM | POA: Diagnosis not present

## 2020-06-11 DIAGNOSIS — H2513 Age-related nuclear cataract, bilateral: Secondary | ICD-10-CM | POA: Diagnosis not present

## 2020-07-05 ENCOUNTER — Telehealth: Payer: Self-pay

## 2020-07-05 NOTE — Telephone Encounter (Signed)
Pt stated that she got her fungal culture report that came back negative. Then she pt got a message from [facility] stating that she will send a prescription in. Pt doesn't want the medication because its 400 and wants to know why she has to get it if the results are negative. Please advise.

## 2020-07-06 ENCOUNTER — Encounter: Payer: Self-pay | Admitting: *Deleted

## 2020-07-06 NOTE — Telephone Encounter (Signed)
Responded to patient through MyChart

## 2020-07-07 NOTE — Telephone Encounter (Signed)
Thank you :)

## 2020-07-12 ENCOUNTER — Ambulatory Visit: Payer: BC Managed Care – PPO | Admitting: Podiatry

## 2020-07-17 ENCOUNTER — Other Ambulatory Visit: Payer: Self-pay

## 2020-07-17 ENCOUNTER — Encounter: Payer: Self-pay | Admitting: Podiatry

## 2020-07-17 ENCOUNTER — Ambulatory Visit (INDEPENDENT_AMBULATORY_CARE_PROVIDER_SITE_OTHER): Payer: BC Managed Care – PPO | Admitting: Podiatry

## 2020-07-17 DIAGNOSIS — M7752 Other enthesopathy of left foot: Secondary | ICD-10-CM | POA: Diagnosis not present

## 2020-07-17 DIAGNOSIS — M7751 Other enthesopathy of right foot: Secondary | ICD-10-CM

## 2020-07-19 NOTE — Progress Notes (Signed)
She presents today for follow-up of her capsulitis states that is still tender.  But better than it was.  Objective: Vital signs are stable alert oriented x3 pain on palpation second metatarsophalangeal joint.  Hallux nail is healing very nicely pathology results demonstrate subungual hematoma no fungus.  Assessment: Chronic capsulitis metatarsophalangeal joint.  Nail dystrophy.  Plan: Discussed etiology pathology and surgical therapies at this point in time she he seen by Raiford Noble today to help offload the forefoot I will follow-up with her once those orthotics come in.

## 2020-08-16 ENCOUNTER — Ambulatory Visit: Payer: BC Managed Care – PPO | Admitting: Orthotics

## 2020-08-16 ENCOUNTER — Other Ambulatory Visit: Payer: Self-pay

## 2020-08-16 DIAGNOSIS — M7752 Other enthesopathy of left foot: Secondary | ICD-10-CM

## 2020-08-16 NOTE — Progress Notes (Signed)
Patient came in today to p/up functional foot orthotics.   The orthotics were assessed to both fit and function.  The F/O addressed the biomechanical issues/pathologies as intended, offering good longitudinal arch support, proper offloading, and foot support. There weren't any signs of discomfort or irritation.  The F/O fit properly in footwear with minimal trimming/adjustments. 

## 2020-08-25 ENCOUNTER — Ambulatory Visit: Payer: BC Managed Care – PPO | Attending: Internal Medicine

## 2020-08-25 DIAGNOSIS — Z23 Encounter for immunization: Secondary | ICD-10-CM

## 2020-08-25 NOTE — Progress Notes (Signed)
   Covid-19 Vaccination Clinic  Name:  [name]    MRN: [id number] DOB: December 03, 1955  08/25/2020  Ms. Fazekas was observed post Covid-19 immunization for 15 minutes without incident. She was provided with Vaccine Information Sheet and instruction to access the V-Safe system.   Ms. Ziegler was instructed to call 911 with any severe reactions post vaccine: Marland Kitchen Difficulty breathing  . Swelling of face and throat  . A fast heartbeat  . A bad rash all over body  . Dizziness and weakness

## 2020-12-26 ENCOUNTER — Telehealth: Payer: BC Managed Care – PPO | Admitting: Medical

## 2020-12-26 ENCOUNTER — Encounter: Payer: Self-pay | Admitting: Medical

## 2020-12-26 ENCOUNTER — Other Ambulatory Visit: Payer: Self-pay

## 2020-12-26 DIAGNOSIS — H6983 Other specified disorders of Eustachian tube, bilateral: Secondary | ICD-10-CM

## 2020-12-26 DIAGNOSIS — H9319 Tinnitus, unspecified ear: Secondary | ICD-10-CM

## 2021-01-16 ENCOUNTER — Ambulatory Visit (INDEPENDENT_AMBULATORY_CARE_PROVIDER_SITE_OTHER): Payer: BC Managed Care – PPO | Admitting: Otolaryngology

## 2021-01-16 ENCOUNTER — Encounter (INDEPENDENT_AMBULATORY_CARE_PROVIDER_SITE_OTHER): Payer: Self-pay | Admitting: Otolaryngology

## 2021-01-16 ENCOUNTER — Other Ambulatory Visit: Payer: Self-pay

## 2021-01-16 VITALS — Temp 97.3°F

## 2021-01-16 DIAGNOSIS — J310 Chronic rhinitis: Secondary | ICD-10-CM

## 2021-01-16 DIAGNOSIS — H9313 Tinnitus, bilateral: Secondary | ICD-10-CM | POA: Diagnosis not present

## 2021-01-16 DIAGNOSIS — H903 Sensorineural hearing loss, bilateral: Secondary | ICD-10-CM | POA: Diagnosis not present

## 2021-01-16 NOTE — Progress Notes (Signed)
HPI: Deborah Fitzpatrick is a 65 y.o. female who presents is referred by her PCP for evaluation of eustachian tube discomfort and allergies.  Patient has a long history of seasonal allergies with nasal congestion and discomfort more in the left ear.  She takes allergy antihistamines to help.  She has used Flonase in the past but this caused headaches and she is not using any allergy nasal sprays presently.  She has had intermittent pressure and congestion more in the left ear.  She has occasional pain in the left ear..  Past Medical History:  Diagnosis Date  . Arrhythmia    No past surgical history on file. Social History   Socioeconomic History  . Marital status: Married    Spouse name: Not on file  . Number of children: Not on file  . Years of education: Not on file  . Highest education level: Not on file  Occupational History  . Not on file  Tobacco Use  . Smoking status: Never Smoker  . Smokeless tobacco: Never Used  Vaping Use  . Vaping Use: Never used  Substance and Sexual Activity  . Alcohol use: No  . Drug use: No  . Sexual activity: Not on file  Other Topics Concern  . Not on file  Social History Narrative  . Not on file   Social Determinants of Health   Financial Resource Strain: Not on file  Food Insecurity: Not on file  Transportation Needs: Not on file  Physical Activity: Not on file  Stress: Not on file  Social Connections: Not on file   Family History  Problem Relation Age of Onset  . Breast cancer Mother 5       and 17  . Breast cancer Maternal Aunt 83  . Heart Problems Father   . Arrhythmia Father    Allergies  Allergen Reactions  . Codeine     Used in a cough syrup caused syncope  . Erythromycin Nausea Only   Prior to Admission medications   Medication Sig Start Date End Date Taking? Authorizing Provider  Cholecalciferol (VITAMIN D PO) Take 5,000 Units by mouth daily.     [provider]     Positive ROS: Otherwise negative  All  other systems have been reviewed and were otherwise negative with the exception of those mentioned in the HPI and as above.  Physical Exam: Constitutional: Alert, well-appearing, no acute distress Ears: External ears without lesions or tenderness. Ear canals are clear bilaterally with intact, clear TMs bilaterally.  Both middle ear spaces are clear.  Ear canals are clear and on exam today no ear abnormality noted to cause ear pain. Nasal: External nose without lesions. Septum is slightly deviated to the left with moderate rhinitis.  She has moderate mucosal swelling on both sides.  After decongesting the nose nasal passages were otherwise clear.  Both middle meatus regions were clear with no signs of infection.  No polyps noted. Oral: Lips and gums without lesions. Tongue and palate mucosa without lesions. Posterior oropharynx clear.  Tonsils appear benign bilaterally. Neck: No palpable adenopathy or masses. Respiratory: Breathing comfortably  Skin: No facial/neck lesions or rash noted.  Audiologic testing in the office today demonstrated downsloping sensorineural hearing loss in both ears above 2000 frequency slightly worse on the left side.  She had normal hearing in the lower mid frequencies.  SRT's were 15 DB on the right and 10 DB on the left.  She had type A tympanograms on tympanometry.  Procedures  Assessment: Chronic allergic rhinitis. Mild upper frequency sensorineural hearing loss in both ears worse on the left side. No ear or TM abnormality noted.  Plan: For the nasal congestion suggested using nasal steroid spray Nasacort 2 sprays each nostril at night as this should help with nasal congestion as well as any eustachian tube dysfunction.  No obvious eustachian tube dysfunction noted on clinical exam today as well as audiologic testing.  She does have some upper frequency sensorineural hearing loss in both ears would recommend repeating audiologic testing in 2 to 3 years.   Narda Bonds, MD   CC:

## 2021-01-25 ENCOUNTER — Encounter: Payer: Self-pay | Admitting: Medical

## 2021-01-29 NOTE — Progress Notes (Signed)
Patient ID: Deborah Fitzpatrick, female   DOB: Oct 04, 1956, 65 y.o.   MRN: [id number]  Patient calls clinic for referral of  ENT in [place]. She is complaining of ear pressure, decreased hearing and ringing in both ears. She also has a history of allergies and ETD.   Given information to patient. To keep me updated. Patient verbalizes understanding and has no questions at discharge.

## 2021-02-03 ENCOUNTER — Encounter: Payer: Self-pay | Admitting: Medical

## 2021-02-14 ENCOUNTER — Encounter (INDEPENDENT_AMBULATORY_CARE_PROVIDER_SITE_OTHER): Payer: Self-pay

## 2021-02-22 DIAGNOSIS — Z23 Encounter for immunization: Secondary | ICD-10-CM | POA: Diagnosis not present

## 2021-03-12 ENCOUNTER — Other Ambulatory Visit: Payer: Self-pay | Admitting: Medical

## 2021-03-12 DIAGNOSIS — Z1231 Encounter for screening mammogram for malignant neoplasm of breast: Secondary | ICD-10-CM

## 2021-04-01 DIAGNOSIS — D229 Melanocytic nevi, unspecified: Secondary | ICD-10-CM | POA: Diagnosis not present

## 2021-04-01 DIAGNOSIS — L821 Other seborrheic keratosis: Secondary | ICD-10-CM | POA: Diagnosis not present

## 2021-04-16 ENCOUNTER — Other Ambulatory Visit: Payer: Self-pay

## 2021-04-16 ENCOUNTER — Telehealth: Payer: BC Managed Care – PPO | Admitting: Medical

## 2021-04-16 DIAGNOSIS — N3090 Cystitis, unspecified without hematuria: Secondary | ICD-10-CM

## 2021-04-16 MED ORDER — AMOXICILLIN-POT CLAVULANATE 875-125 MG PO TABS: 1.0000 | ORAL_TABLET | Freq: Two times a day (BID) | ORAL | 0 refills | Status: DC

## 2021-04-16 NOTE — Progress Notes (Signed)
   Subjective:    Patient ID: Deborah Fitzpatrick, female    DOB: 05/20/1956, 66 y.o.   MRN: [id number]  HPI 65 yo female in non acute distress, consents to telemedicien appoitment, with symptoms of  urinary frequency, urgency and no hematuria. Denies fever or chills or back pain. Drinking cranberry juice and lots of water.   Allergies  Allergen Reactions   Codeine     Used in a cough syrup caused syncope   Erythromycin Nausea Only    Review of Systems  Constitutional:  Negative for chills and fever.  Genitourinary:  Positive for dysuria, frequency and urgency. Negative for hematuria.  Musculoskeletal:  Negative for back pain.  Neurological:  Negative for dizziness and light-headedness.      Objective:   Physical Exam  AXOX3 No distress noted on phone call.  No physical performed due to telemedicine appointment.      Assessment & Plan:  Cystitis without hematuria Meds ordered this encounter  Medications   amoxicillin-clavulanate (AUGMENTIN) 875-125 MG tablet    Sig: Take 1 tablet by mouth 2 (two) times daily. Take with food    Dispense:  14 tablet    Refill:  0    Call in 3-4 days if symptoms do not improving. Patient verbalizes understanding and has no questions at the end of our conversation.

## 2021-04-17 ENCOUNTER — Encounter: Payer: Self-pay | Admitting: Medical

## 2021-05-10 ENCOUNTER — Other Ambulatory Visit: Payer: Self-pay

## 2021-05-10 ENCOUNTER — Ambulatory Visit
Admission: RE | Admit: 2021-05-10 | Discharge: 2021-05-10 | Disposition: A | Discharge status: Home / Self Care | Payer: BC Managed Care – PPO | Source: Ambulatory Visit | Attending: Medical | Admitting: Medical

## 2021-05-10 DIAGNOSIS — Z1231 Encounter for screening mammogram for malignant neoplasm of breast: Secondary | ICD-10-CM | POA: Diagnosis not present

## 2021-05-24 DIAGNOSIS — N898 Other specified noninflammatory disorders of vagina: Secondary | ICD-10-CM | POA: Diagnosis not present

## 2021-05-24 DIAGNOSIS — Z01419 Encounter for gynecological examination (general) (routine) without abnormal findings: Secondary | ICD-10-CM | POA: Diagnosis not present

## 2021-06-13 DIAGNOSIS — H33303 Unspecified retinal break, bilateral: Secondary | ICD-10-CM | POA: Diagnosis not present

## 2021-06-13 DIAGNOSIS — H2513 Age-related nuclear cataract, bilateral: Secondary | ICD-10-CM | POA: Diagnosis not present

## 2021-06-13 DIAGNOSIS — H524 Presbyopia: Secondary | ICD-10-CM | POA: Diagnosis not present

## 2021-07-18 DIAGNOSIS — Z23 Encounter for immunization: Secondary | ICD-10-CM | POA: Diagnosis not present

## 2021-08-01 DIAGNOSIS — Z23 Encounter for immunization: Secondary | ICD-10-CM | POA: Diagnosis not present

## 2021-08-11 IMAGING — MG DIGITAL SCREENING BILAT W/ TOMO W/ CAD
8 series · 9 of 24 positions shown · non-contrast
Comparison: Previous exam(s).

CLINICAL DATA: Screening.

EXAM:
DIGITAL SCREENING BILATERAL MAMMOGRAM WITH TOMO AND CAD

[L CC synth-2D]
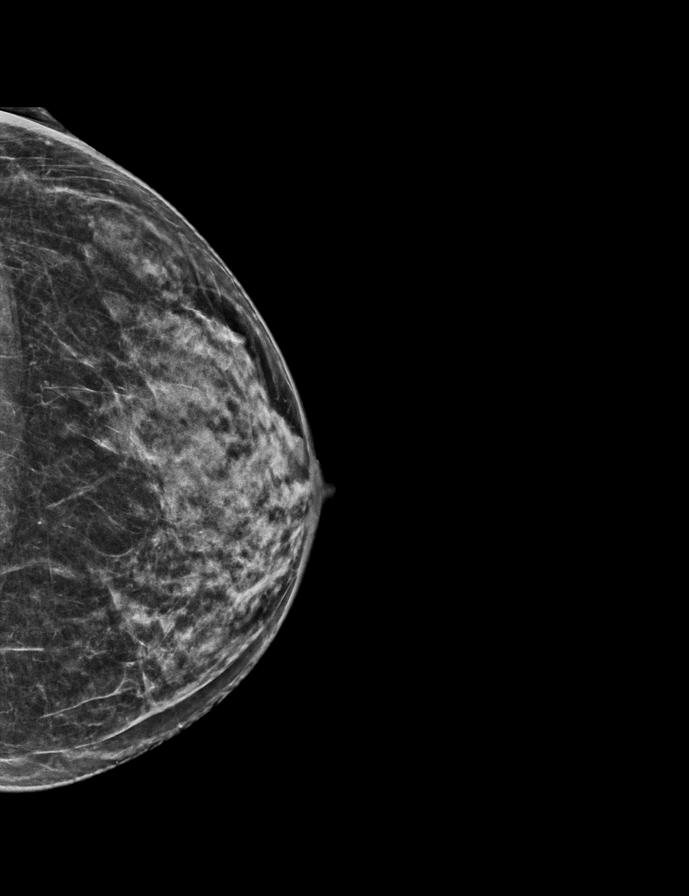

[R CC synth-2D]
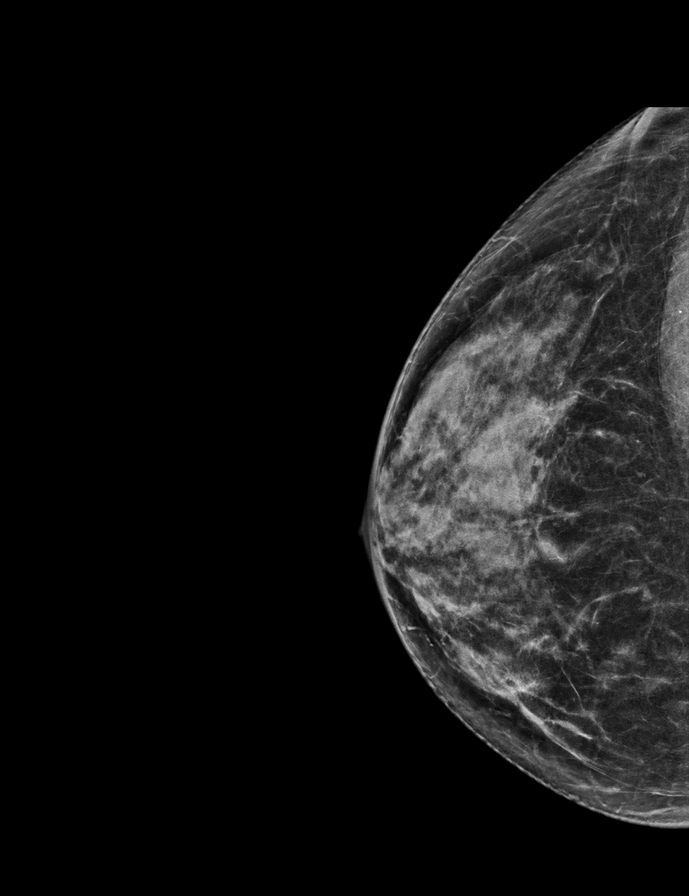

[L MLO synth-2D]
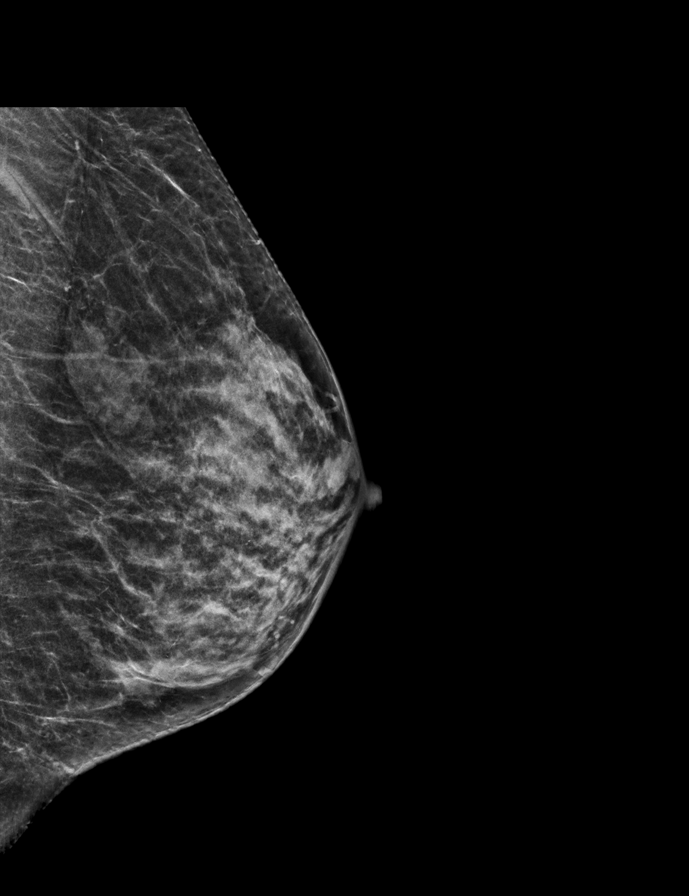

[R MLO synth-2D]
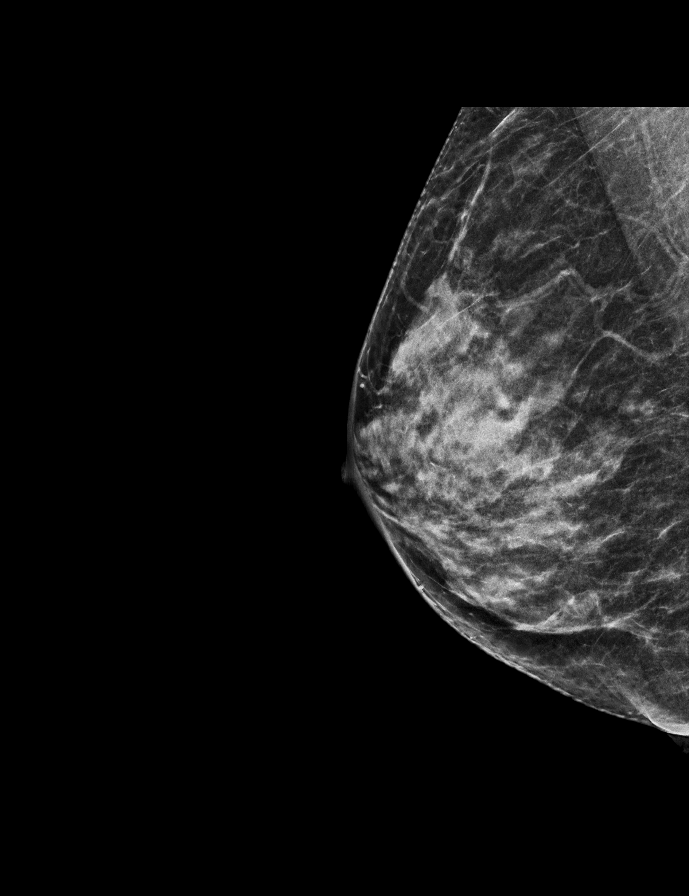

[R MLO tomo · 2 of 50 frames shown]
[frame 17/50]
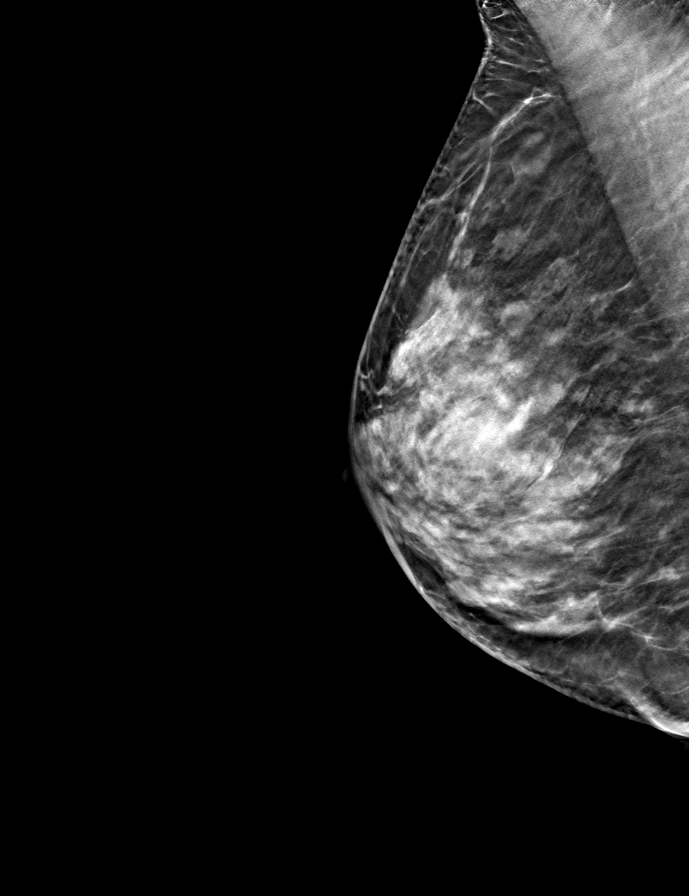
[frame 25/50]
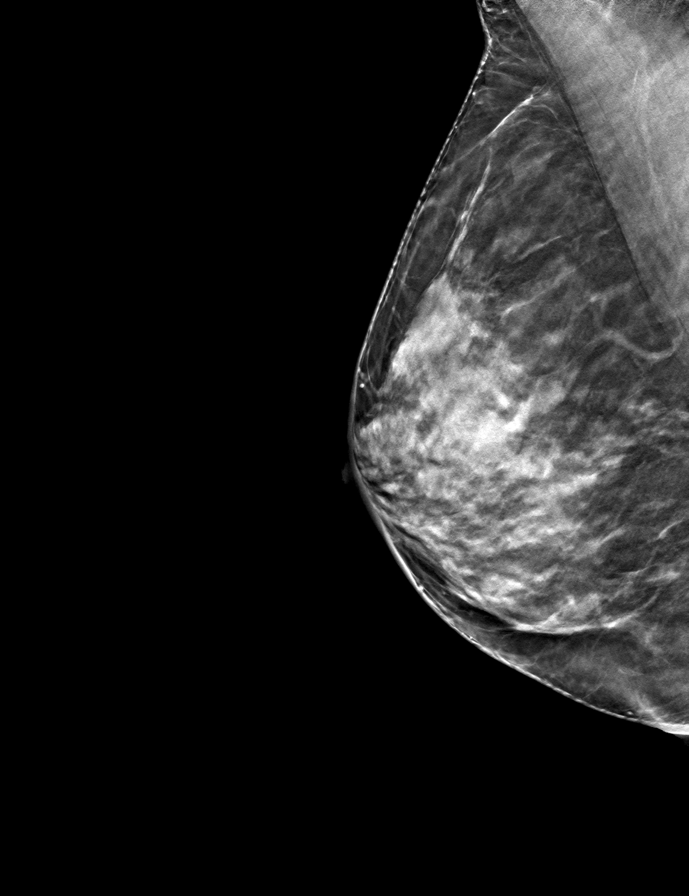

[R CC tomo · tomo slice 29/56.0]
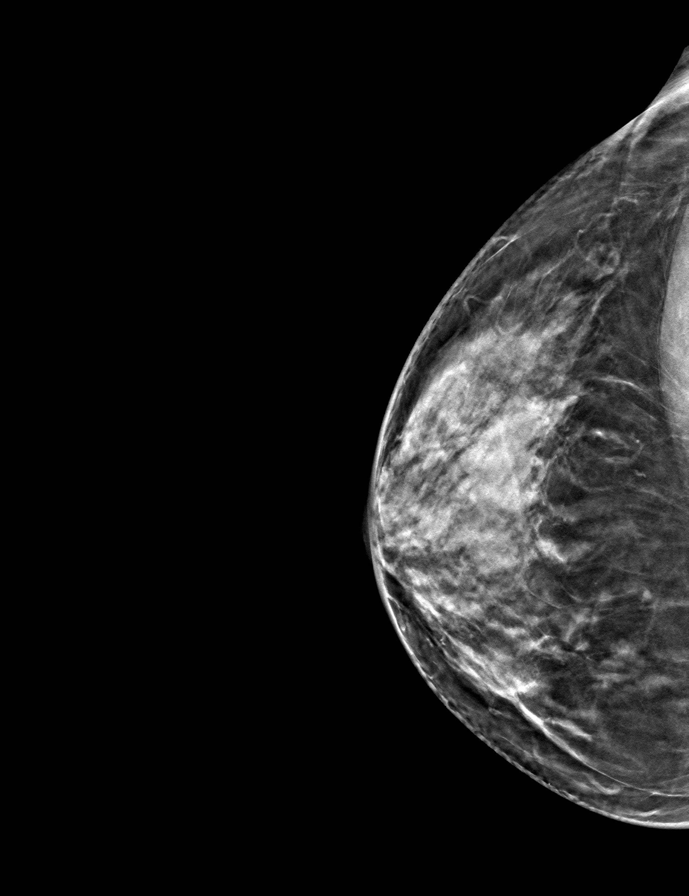

[L MLO tomo · tomo slice 23/46.0]
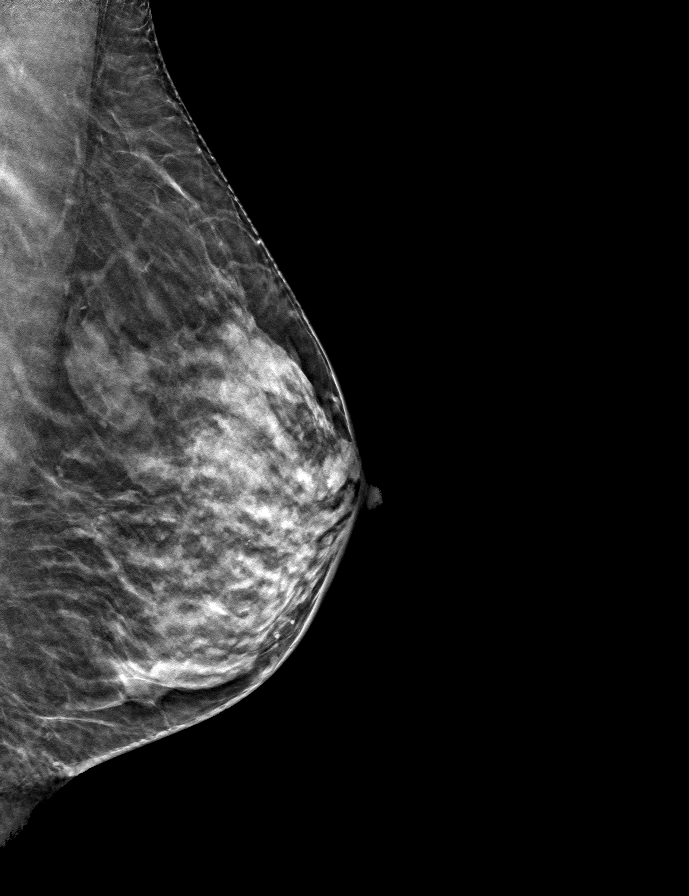

[L CC tomo · tomo slice 25/49.0]
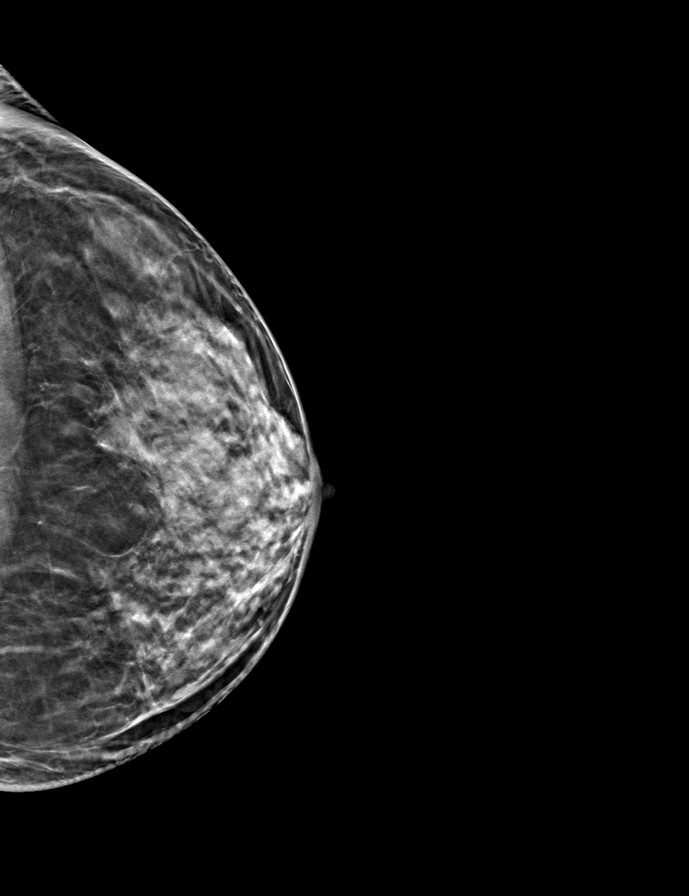

[9 of 24 positions shown; findings below may reference images not displayed]

ACR Breast Density Category d: The breast tissue is extremely dense,
which lowers the sensitivity of mammography
FINDINGS: There are no findings suspicious for malignancy. Images were
processed with CAD.
IMPRESSION: No mammographic evidence of malignancy. A result letter of this
screening mammogram will be mailed directly to the patient.

RECOMMENDATION:
Screening mammogram in one year. (Code:[id number]-[id number])

BI-RADS CATEGORY  1: Negative.

## 2021-09-16 DIAGNOSIS — I340 Nonrheumatic mitral (valve) insufficiency: Secondary | ICD-10-CM | POA: Diagnosis not present

## 2021-10-09 DIAGNOSIS — Z79899 Other long term (current) drug therapy: Secondary | ICD-10-CM | POA: Diagnosis not present

## 2021-10-09 DIAGNOSIS — I4891 Unspecified atrial fibrillation: Secondary | ICD-10-CM | POA: Diagnosis not present

## 2021-10-09 DIAGNOSIS — I511 Rupture of chordae tendineae, not elsewhere classified: Secondary | ICD-10-CM | POA: Diagnosis not present

## 2021-10-09 DIAGNOSIS — I340 Nonrheumatic mitral (valve) insufficiency: Secondary | ICD-10-CM | POA: Diagnosis not present

## 2021-10-09 DIAGNOSIS — R110 Nausea: Secondary | ICD-10-CM | POA: Diagnosis not present

## 2021-10-09 DIAGNOSIS — I4892 Unspecified atrial flutter: Secondary | ICD-10-CM | POA: Diagnosis not present

## 2021-10-09 DIAGNOSIS — T797XXA Traumatic subcutaneous emphysema, initial encounter: Secondary | ICD-10-CM | POA: Diagnosis not present

## 2021-10-09 DIAGNOSIS — J939 Pneumothorax, unspecified: Secondary | ICD-10-CM | POA: Diagnosis not present

## 2021-10-09 DIAGNOSIS — Z888 Allergy status to other drugs, medicaments and biological substances status: Secondary | ICD-10-CM | POA: Diagnosis not present

## 2021-10-09 DIAGNOSIS — I517 Cardiomegaly: Secondary | ICD-10-CM | POA: Diagnosis not present

## 2021-10-09 DIAGNOSIS — Z4682 Encounter for fitting and adjustment of non-vascular catheter: Secondary | ICD-10-CM | POA: Diagnosis not present

## 2021-10-09 DIAGNOSIS — I341 Nonrheumatic mitral (valve) prolapse: Secondary | ICD-10-CM | POA: Diagnosis not present

## 2021-10-09 DIAGNOSIS — I110 Hypertensive heart disease with heart failure: Secondary | ICD-10-CM | POA: Diagnosis not present

## 2021-10-09 DIAGNOSIS — Z9889 Other specified postprocedural states: Secondary | ICD-10-CM | POA: Diagnosis not present

## 2021-10-09 DIAGNOSIS — Z20822 Contact with and (suspected) exposure to covid-19: Secondary | ICD-10-CM | POA: Diagnosis not present

## 2021-10-09 DIAGNOSIS — I5032 Chronic diastolic (congestive) heart failure: Secondary | ICD-10-CM | POA: Diagnosis not present

## 2021-10-09 DIAGNOSIS — E782 Mixed hyperlipidemia: Secondary | ICD-10-CM | POA: Diagnosis not present

## 2021-10-09 DIAGNOSIS — Z885 Allergy status to narcotic agent status: Secondary | ICD-10-CM | POA: Diagnosis not present

## 2021-10-09 DIAGNOSIS — Z8679 Personal history of other diseases of the circulatory system: Secondary | ICD-10-CM | POA: Diagnosis not present

## 2021-11-06 ENCOUNTER — Other Ambulatory Visit: Payer: Self-pay

## 2021-11-06 ENCOUNTER — Other Ambulatory Visit: Payer: BC Managed Care – PPO

## 2021-11-06 DIAGNOSIS — Z0000 Encounter for general adult medical examination without abnormal findings: Secondary | ICD-10-CM

## 2021-11-06 DIAGNOSIS — E559 Vitamin D deficiency, unspecified: Secondary | ICD-10-CM

## 2021-11-06 LAB — POCT URINALYSIS DIPSTICK
Bilirubin, UA: NEGATIVE
Glucose, UA: NEGATIVE
Ketones, UA: NEGATIVE
Leukocytes, UA: NEGATIVE
Nitrite, UA: NEGATIVE
Protein, UA: NEGATIVE
Spec Grav, UA: <=1.005 — AB (ref 1.010–1.025)
Urobilinogen, UA: 0.2 E.U./dL
pH, UA: 7 (ref 5.0–8.0)

## 2021-11-06 NOTE — Progress Notes (Signed)
Patient ID: Deborah Fitzpatrick, female   DOB: [birth year], 66 y.o.   MRN: [id number] Patient coming in to the clinic for labs ;thers are for her complete physical.

## 2021-11-07 LAB — CMP12+LP+TP+TSH+6AC+CBC/D/PLT
ALT: 24 IU/L (ref 0–32)
AST: 25 IU/L (ref 0–40)
Albumin/Globulin Ratio: 1.8 (ref 1.2–2.2)
Albumin: 4.6 g/dL (ref 3.8–4.8)
Alkaline Phosphatase: 70 IU/L (ref 44–121)
BUN/Creatinine Ratio: 17 (ref 12–28)
BUN: 13 mg/dL (ref 8–27)
Basophils Absolute: 0.1 10*3/uL (ref 0.0–0.2)
Basos: 2 %
Bilirubin Total: 0.8 mg/dL (ref 0.0–1.2)
Calcium: 9.4 mg/dL (ref 8.7–10.3)
Chloride: 100 mmol/L (ref 96–106)
Chol/HDL Ratio: 2 ratio (ref 0.0–4.4)
Cholesterol, Total: 196 mg/dL (ref 100–199)
Creatinine, Ser: 0.75 mg/dL (ref 0.57–1.00)
EOS (ABSOLUTE): 0.2 10*3/uL (ref 0.0–0.4)
Eos: 3 %
Estimated CHD Risk: <0.5 times avg. (ref 0.0–1.0)
Free Thyroxine Index: 2 (ref 1.2–4.9)
GGT: 23 IU/L (ref 0–60)
Globulin, Total: 2.6 g/dL (ref 1.5–4.5)
Glucose: 93 mg/dL (ref 70–99)
HDL: 99 mg/dL (ref >39)
Hematocrit: 38.9 % (ref 34.0–46.6)
Hemoglobin: 13.8 g/dL (ref 11.1–15.9)
Immature Grans (Abs): 0 10*3/uL (ref 0.0–0.1)
Immature Granulocytes: 0 %
Iron: 127 ug/dL (ref 27–139)
LDH: 188 IU/L (ref 119–226)
LDL Chol Calc (NIH): 87 mg/dL (ref 0–99)
Lymphocytes Absolute: 1.7 10*3/uL (ref 0.7–3.1)
Lymphs: 35 %
MCH: 30.8 pg (ref 26.6–33.0)
MCHC: 35.5 g/dL (ref 31.5–35.7)
MCV: 87 fL (ref 79–97)
Monocytes Absolute: 0.4 10*3/uL (ref 0.1–0.9)
Monocytes: 7 %
Neutrophils Absolute: 2.5 10*3/uL (ref 1.4–7.0)
Neutrophils: 53 %
Phosphorus: 3.3 mg/dL (ref 3.0–4.3)
Potassium: 4.1 mmol/L (ref 3.5–5.2)
RBC: 4.48 x10E6/uL (ref 3.77–5.28)
RDW: 13.1 % (ref 11.7–15.4)
Sodium: 138 mmol/L (ref 134–144)
T3 Uptake Ratio: 26 % (ref 24–39)
T4, Total: 7.5 ug/dL (ref 4.5–12.0)
TSH: 1.99 u[IU]/mL (ref 0.450–4.500)
Total Protein: 7.2 g/dL (ref 6.0–8.5)
Triglycerides: 53 mg/dL (ref 0–149)
Uric Acid: 5.6 mg/dL (ref 3.0–7.2)
VLDL Cholesterol Cal: 10 mg/dL (ref 5–40)
WBC: 4.8 10*3/uL (ref 3.4–10.8)
eGFR: 89 mL/min/{1.73_m2} (ref >59)

## 2021-11-07 LAB — VITAMIN D 25 HYDROXY (VIT D DEFICIENCY, FRACTURES): Vit D, 25-Hydroxy: 40.4 ng/mL (ref 30.0–100.0)

## 2021-11-07 LAB — HGB A1C W/O EAG: Hgb A1c MFr Bld: 5.6 % (ref 4.8–5.6)

## 2021-11-12 ENCOUNTER — Encounter: Payer: Self-pay | Admitting: Medical

## 2021-11-13 ENCOUNTER — Encounter: Payer: Self-pay | Admitting: Medical

## 2021-11-13 ENCOUNTER — Other Ambulatory Visit: Payer: Self-pay

## 2021-11-13 ENCOUNTER — Ambulatory Visit: Payer: BC Managed Care – PPO | Admitting: Medical

## 2021-11-13 VITALS — BP 120/72 | HR 65 | Temp 97.1°F | Resp 18 | Ht 64.0 in | Wt 145.6 lb

## 2021-11-13 DIAGNOSIS — Z0000 Encounter for general adult medical examination without abnormal findings: Secondary | ICD-10-CM

## 2021-11-13 DIAGNOSIS — H6123 Impacted cerumen, bilateral: Secondary | ICD-10-CM

## 2021-11-13 NOTE — Progress Notes (Signed)
Subjective:    Patient ID: Deborah Fitzpatrick, female    DOB: 03/06/1956, 66 y.o.   MRN: [id number]  HPI 66 yo female in non acute distress presents today for annual physical exam. No complaints today. No complaints today.   Blood pressure 120/72, pulse 65, temperature (!) 97.1 F (36.2 C), temperature source Tympanic, resp. rate 18, height _0  (1.626 m), weight 145 lb 9.6 oz (66 kg), SpO2 97 %.   20/40 right 20/30 left 20/30 both  Past Medical History:  Diagnosis Date   Arrhythmia     No past surgical history on file.   Allergies  Allergen Reactions   Codeine     Used in a cough syrup caused syncope   Erythromycin Nausea Only   Family History  Problem Relation Age of Onset   Breast cancer Mother 16       and 90   Breast cancer Maternal Aunt 83   Heart Problems Father    Arrhythmia Father     Social History   Socioeconomic History   Marital status: Married    Spouse name: Not on file   Number of children: Not on file   Years of education: Not on file   Highest education level: Not on file  Occupational History   Not on file  Tobacco Use   Smoking status: Never   Smokeless tobacco: Never  Vaping Use   Vaping Use: Never used  Substance and Sexual Activity   Alcohol use: No   Drug use: No   Sexual activity: Not on file  Other Topics Concern   Not on file  Social History Narrative   Not on file   Social Determinants of Health   Financial Resource Strain: Not on file  Food Insecurity: Not on file  Transportation Needs: Not on file  Physical Activity: Not on file  Stress: Not on file  Social Connections: Not on file  Intimate Partner Violence: Not on file      Review of Systems  HENT:  Positive for sinus pressure (occasion).   Eyes:  Positive for itching (allergies only).  Allergic/Immunologic: Positive for environmental allergies.  All other systems reviewed and are negative.     Objective:   Physical Exam Vitals and nursing note reviewed.   Constitutional:      Appearance: Normal appearance. She is normal weight.  HENT:     Head: Normocephalic and atraumatic.     Jaw: There is normal jaw occlusion.     Right Ear: Ear canal and external ear normal. A middle ear effusion is present.     Left Ear: Ear canal and external ear normal. A middle ear effusion is present.     Ears:     Comments: Each canal  1/2 obscured by wax Eyes:     General: Lids are normal. Vision grossly intact. Gaze aligned appropriately.     Extraocular Movements: Extraocular movements intact.     Conjunctiva/sclera: Conjunctivae normal.     Pupils: Pupils are equal, round, and reactive to light.  Neck:     Thyroid: No thyroid mass, thyromegaly or thyroid tenderness.     Trachea: Trachea normal.  Cardiovascular:     Rate and Rhythm: Normal rate and regular rhythm.     Pulses: Normal pulses.          Radial pulses are 2+ on the right side and 2+ on the left side.       Popliteal pulses are 2+ on the right  side and 2+ on the left side.       Dorsalis pedis pulses are 2+ on the right side and 2+ on the left side.       Posterior tibial pulses are 2+ on the right side and 2+ on the left side.     Heart sounds: Normal heart sounds, S1 normal and S2 normal.  Pulmonary:     Effort: Pulmonary effort is normal.     Breath sounds: Normal breath sounds and air entry.  Abdominal:     General: Abdomen is flat. Bowel sounds are normal. There is no distension.     Palpations: Abdomen is soft.  Musculoskeletal:     Cervical back: Normal range of motion and neck supple.     Right lower leg: No edema.     Left lower leg: No edema.     Right foot: Bunion present.  Feet:     Right foot:     Skin integrity: Skin integrity normal.     Left foot:     Skin integrity: Skin integrity normal.  Lymphadenopathy:     Head:     Right side of head: No submental, submandibular, tonsillar, preauricular or posterior auricular adenopathy.     Left side of head: No submental,  submandibular, tonsillar, preauricular or posterior auricular adenopathy.     Cervical: No cervical adenopathy.     Right cervical: No superficial or deep cervical adenopathy.    Left cervical: No superficial or deep cervical adenopathy.     Upper Body:     Right upper body: No supraclavicular or epitrochlear adenopathy.     Left upper body: No supraclavicular or epitrochlear adenopathy.  Skin:    General: Skin is warm.     Capillary Refill: Capillary refill takes less than 2 seconds.  Neurological:     General: No focal deficit present.     Mental Status: She is alert.     GCS: GCS eye subscore is 4. GCS verbal subscore is 5. GCS motor subscore is 6.     Cranial Nerves: Cranial nerves 2-12 are intact.     Sensory: Sensation is intact.     Motor: Motor function is intact.     Coordination: Coordination is intact.     Gait: Gait is intact.  Psychiatric:        Attention and Perception: Attention and perception normal.        Mood and Affect: Mood and affect normal.        Speech: Speech normal.        Behavior: Behavior normal. Behavior is cooperative.        Thought Content: Thought content normal.        Cognition and Memory: Cognition and memory normal.        Judgment: Judgment normal.          Recent Results (from the past 2160 hour(s))  Hgb A1c w/o eAG     Status: None   Collection Time: 11/06/21  8:29 AM  Result Value Ref Range   Hgb A1c MFr Bld 5.6 4.8 - 5.6 %    Comment:          Prediabetes: 5.7 - 6.4          Diabetes: >6.4          Glycemic control for adults with diabetes: <7.0   CMP12+LP+TP+TSH+6AC+CBC/D/Plt     Status: None   Collection Time: 11/06/21  8:29 AM  Result Value Ref Range  Glucose 93 70 - 99 mg/dL   Uric Acid 5.6 3.0 - 7.2 mg/dL    Comment:            Therapeutic target for gout patients: <6.0   BUN 13 8 - 27 mg/dL   Creatinine, Ser 0.75 0.57 - 1.00 mg/dL   eGFR 89 >59 mL/min/1.73   BUN/Creatinine Ratio 17 12 - 28   Sodium 138 134 - 144  mmol/L   Potassium 4.1 3.5 - 5.2 mmol/L   Chloride 100 96 - 106 mmol/L   Calcium 9.4 8.7 - 10.3 mg/dL   Phosphorus 3.3 3.0 - 4.3 mg/dL   Total Protein 7.2 6.0 - 8.5 g/dL   Albumin 4.6 3.8 - 4.8 g/dL   Globulin, Total 2.6 1.5 - 4.5 g/dL   Albumin/Globulin Ratio 1.8 1.2 - 2.2   Bilirubin Total 0.8 0.0 - 1.2 mg/dL   Alkaline Phosphatase 70 44 - 121 IU/L   LDH 188 119 - 226 IU/L   AST 25 0 - 40 IU/L   ALT 24 0 - 32 IU/L   GGT 23 0 - 60 IU/L   Iron 127 27 - 139 ug/dL   Cholesterol, Total 196 100 - 199 mg/dL   Triglycerides 53 0 - 149 mg/dL   HDL 99 >39 mg/dL   VLDL Cholesterol Cal 10 5 - 40 mg/dL   LDL Chol Calc (NIH) 87 0 - 99 mg/dL   Chol/HDL Ratio 2.0 0.0 - 4.4 ratio    Comment:                                   T. Chol/HDL Ratio                                             Men  Women                               1/2 Avg.Risk  3.4    3.3                                   Avg.Risk  5.0    4.4                                2X Avg.Risk  9.6    7.1                                3X Avg.Risk 23.4   11.0    Estimated CHD Risk  < 0.5 0.0 - 1.0 times avg.    Comment: The CHD Risk is based on the T. Chol/HDL ratio. Other factors affect CHD Risk such as hypertension, smoking, diabetes, severe obesity, and family history of premature CHD.    TSH 1.990 0.450 - 4.500 uIU/mL   T4, Total 7.5 4.5 - 12.0 ug/dL   T3 Uptake Ratio 26 24 - 39 %   Free Thyroxine Index 2.0 1.2 - 4.9   WBC 4.8 3.4 - 10.8 x10E3/uL   RBC 4.48 3.77 - 5.28 x10E6/uL   Hemoglobin 13.8 11.1 -  15.9 g/dL   Hematocrit 38.9 34.0 - 46.6 %   MCV 87 79 - 97 fL   MCH 30.8 26.6 - 33.0 pg   MCHC 35.5 31.5 - 35.7 g/dL   RDW 13.1 11.7 - 15.4 %   Platelets CANCELED x10E3/uL    Comment: Unable to perform an accurate platelet count due to aggregation of the platelets.  Result canceled by the ancillary.    Neutrophils 53 Not Estab. %   Lymphs 35 Not Estab. %   Monocytes 7 Not Estab. %   Eos 3 Not Estab. %   Basos 2 Not  Estab. %   Neutrophils Absolute 2.5 1.4 - 7.0 x10E3/uL   Lymphocytes Absolute 1.7 0.7 - 3.1 x10E3/uL   Monocytes Absolute 0.4 0.1 - 0.9 x10E3/uL   EOS (ABSOLUTE) 0.2 0.0 - 0.4 x10E3/uL   Basophils Absolute 0.1 0.0 - 0.2 x10E3/uL   Immature Granulocytes 0 Not Estab. %   Immature Grans (Abs) 0.0 0.0 - 0.1 x10E3/uL   Hematology Comments: Note:     Comment: Verified by microscopic examination.  VITAMIN D 25 Hydroxy (Vit-D Deficiency, Fractures)     Status: None   Collection Time: 11/06/21  8:29 AM  Result Value Ref Range   Vit D, 25-Hydroxy 40.4 30.0 - 100.0 ng/mL    Comment: Vitamin D deficiency has been defined by the [employer] practice guideline as a level of serum 25-OH vitamin D less than 20 ng/mL (1,2). The Endocrine Society went on to further define vitamin D insufficiency as a level between 21 and 29 ng/mL (2). 1. IOM (Institute of Medicine). 2010. Dietary reference    intakes for calcium and D. [place]: The    [employer]. 2. Holick MF, Binkley [place], Bischoff-Ferrari HA, et al.    Evaluation, treatment, and prevention of vitamin D    deficiency: an Endocrine Society clinical practice    guideline. JCEM. 2011 Jul; 96(7):1911-30.   POCT urinalysis dipstick     Status: Abnormal   Collection Time: 11/06/21  8:49 AM  Result Value Ref Range   Color, UA yellow    Clarity, UA clear    Glucose, UA Negative Negative   Bilirubin, UA Negative    Ketones, UA Negative    Spec Grav, UA <=1.005 (A) 1.010 - 1.025   Blood, UA Trace    pH, UA 7.0 5.0 - 8.0   Protein, UA Negative Negative   Urobilinogen, UA 0.2 0.2 or 1.0 E.U./dL   Nitrite, UA Negative    Leukocytes, UA Negative Negative   Appearance     Odor      Assessment & Plan:  Annual physical exam Cerumen in both ears Recommend  OTC Debrox Otic drops 5-10 drops each ear twice a day for 4 days then rinse in shower. Utilize one time a month for maintenance. Patient  verbalizes understanding and has no questions at discharge.

## 2022-03-27 ENCOUNTER — Other Ambulatory Visit: Payer: Self-pay | Admitting: Medical

## 2022-03-27 DIAGNOSIS — Z1231 Encounter for screening mammogram for malignant neoplasm of breast: Secondary | ICD-10-CM

## 2022-04-03 ENCOUNTER — Encounter: Payer: Self-pay | Admitting: Medical

## 2022-04-14 ENCOUNTER — Encounter: Payer: Self-pay | Admitting: Medical

## 2022-04-14 ENCOUNTER — Telehealth: Payer: BC Managed Care – PPO | Admitting: Medical

## 2022-04-15 ENCOUNTER — Encounter: Payer: Self-pay | Admitting: Medical

## 2022-04-15 ENCOUNTER — Ambulatory Visit: Payer: BC Managed Care – PPO | Admitting: Medical

## 2022-04-15 NOTE — Patient Instructions (Signed)
Community-Acquired Pneumonia, Adult Pneumonia is an infection of the lungs. It causes irritation and swelling in the airways of the lungs. Mucus and fluid may also build up inside the airways. This may cause coughing and trouble breathing. One type of pneumonia can happen while you are in a hospital. A different type can happen when you are not in a hospital (community-acquired pneumonia). What are the causes?  This condition is caused by germs (viruses, bacteria, or fungi). Some types of germs can spread from person to person. Pneumonia is not thought to spread from person to person. What increases the risk? You are more likely to develop this condition if: You have a long-term (chronic) disease, such as: Disease of the lungs. This may be chronic obstructive pulmonary disease (COPD) or asthma. Heart failure. Cystic fibrosis. Diabetes. Kidney disease. Sickle cell disease. HIV. You have other health problems, such as: Your body's defense system (immune system) is weak. A condition that may cause you to breathe in fluids from your mouth and nose. You had your spleen taken out. You do not take good care of your teeth and mouth (poor dental hygiene). You use or have used tobacco products. You travel where the germs that cause this illness are common. You are near certain animals or the places they live. You are older than 65 years of age. What are the signs or symptoms? Symptoms of this condition include: A cough. A fever. Sweating or chills. Chest pain, often when you breathe deeply or cough. Breathing problems, such as: Fast breathing. Trouble breathing. Shortness of breath. Feeling tired (fatigued). Muscle aches. How is this treated? Treatment for this condition depends on many things, such as: The cause of your illness. Your medicines. Your other health problems. Most adults can be treated at home. Sometimes, treatment must happen in a hospital. Treatment may include  medicines to kill germs. Medicines may depend on which germ caused your illness. Very bad pneumonia is rare. If you get it, you may: Have a machine to help you breathe. Have fluid taken away from around your lungs. Follow these instructions at home: Medicines Take over-the-counter and prescription medicines only as told by your doctor. Take cough medicine only if you are losing sleep. Cough medicine can keep your body from taking mucus away from your lungs. If you were prescribed an antibiotic medicine, take it as told by your doctor. Do not stop taking the antibiotic even if you start to feel better. Lifestyle     Do not drink alcohol. Do not use any products that contain nicotine or tobacco, such as cigarettes, e-cigarettes, and chewing tobacco. If you need help quitting, ask your doctor. Eat a healthy diet. This includes a lot of vegetables, fruits, whole grains, low-fat dairy products, and low-fat (lean) protein. General instructions  Rest a lot. Sleep for at least 8 hours each night. Sleep with your head and neck raised. Put a few pillows under your head or sleep in a reclining chair. Return to your normal activities as told by your doctor. Ask your doctor what activities are safe for you. Drink enough fluid to keep your pee (urine) pale yellow. If your throat is sore, rinse your mouth often with salt water. To make salt water, dissolve -1 tsp (3-6 g) of salt in 1 cup (237 mL) of warm water. Keep all follow-up visits as told by your doctor. This is important. How is this prevented? You can lower your risk of pneumonia by: Getting the pneumonia shot (vaccine). These   shots have different types and schedules. Ask your doctor what works best for you. Think about getting this shot if: You are older than 65 years of age. You are 19-65 years of age and: You are being treated for cancer. You have long-term lung disease. You have other problems that affect your body's defense system. Ask  your doctor if you have one of these. Getting your flu shot every year. Ask your doctor which type of shot is best for you. Going to the dentist as often as told. Washing your hands often with soap and water for at least 20 seconds. If you cannot use soap and water, use hand sanitizer. Contact a doctor if: You have a fever. You lose sleep because your cough medicine does not help. Get help right away if: You are short of breath and this gets worse. You have more chest pain. Your sickness gets worse. This is very serious if: You are an older adult. Your body's defense system is weak. You cough up blood. These symptoms may be an emergency. Do not wait to see if the symptoms will go away. Get medical help right away. Call your local emergency services (911 in the [place] Do not drive yourself to the hospital. Summary Pneumonia is an infection of the lungs. Community-acquired pneumonia affects people who have not been in the hospital. Certain germs can cause this infection. This condition may be treated with medicines that kill germs. For very bad pneumonia, you may need a hospital stay and treatment to help with breathing. This information is not intended to replace advice given to you by your health care provider. Make sure you discuss any questions you have with your health care provider. Document Revised: 07/26/2019 Document Reviewed: 07/26/2019 Elsevier Patient Education  2023 [employer] Inc.  

## 2022-04-15 NOTE — Progress Notes (Signed)
Patient gives permission for telemedicine appointment.    Deborah Fitzpatrick had questions about the Pneumococcal Vaccine, Covid booster ( Bivalent) and Shingles vaccine.   She would like to catch up on these while she has more time that is flexible with her job incase she gets ill from the vaccine.  I recommended she get vaccinated per her pharmacy recommendations.  She verbalized understanding , I answered all her questions to the best of my ability.  Sent patient  [email or web address]

## 2022-05-07 ENCOUNTER — Encounter: Payer: Self-pay | Admitting: Medical

## 2022-05-13 ENCOUNTER — Ambulatory Visit
Admission: RE | Admit: 2022-05-13 | Discharge: 2022-05-13 | Disposition: A | Discharge status: Home / Self Care | Payer: BC Managed Care – PPO | Source: Ambulatory Visit | Attending: Medical | Admitting: Medical

## 2022-05-13 DIAGNOSIS — Z1231 Encounter for screening mammogram for malignant neoplasm of breast: Secondary | ICD-10-CM

## 2022-05-26 ENCOUNTER — Other Ambulatory Visit: Payer: Self-pay | Admitting: Nurse Practitioner

## 2022-05-26 ENCOUNTER — Other Ambulatory Visit (HOSPITAL_COMMUNITY)
Admission: RE | Admit: 2022-05-26 | Discharge: 2022-05-26 | Disposition: A | Discharge status: Home / Self Care | Payer: BC Managed Care – PPO | Source: Ambulatory Visit | Attending: Nurse Practitioner | Admitting: Nurse Practitioner

## 2022-05-26 DIAGNOSIS — Z01419 Encounter for gynecological examination (general) (routine) without abnormal findings: Secondary | ICD-10-CM | POA: Diagnosis not present

## 2022-05-26 DIAGNOSIS — Z124 Encounter for screening for malignant neoplasm of cervix: Secondary | ICD-10-CM | POA: Insufficient documentation

## 2022-05-28 LAB — CYTOLOGY - PAP
Comment: NEGATIVE
High risk HPV: NEGATIVE

## 2022-05-30 ENCOUNTER — Other Ambulatory Visit: Payer: Self-pay | Admitting: Nurse Practitioner

## 2022-05-30 DIAGNOSIS — Z780 Asymptomatic menopausal state: Secondary | ICD-10-CM

## 2022-06-04 ENCOUNTER — Encounter: Payer: Self-pay | Admitting: Nurse Practitioner

## 2022-06-11 ENCOUNTER — Ambulatory Visit (INDEPENDENT_AMBULATORY_CARE_PROVIDER_SITE_OTHER): Payer: Self-pay | Admitting: Nurse Practitioner

## 2022-06-11 DIAGNOSIS — E559 Vitamin D deficiency, unspecified: Secondary | ICD-10-CM

## 2022-06-11 NOTE — Progress Notes (Signed)
OB[facility]N over the summer @[facility]  Mammogram 04/2022 Bone density ordered scheduled

## 2022-06-16 DIAGNOSIS — H2513 Age-related nuclear cataract, bilateral: Secondary | ICD-10-CM | POA: Diagnosis not present

## 2022-07-03 ENCOUNTER — Other Ambulatory Visit: Payer: BC Managed Care – PPO

## 2022-08-29 DIAGNOSIS — Z23 Encounter for immunization: Secondary | ICD-10-CM | POA: Diagnosis not present

## 2022-11-19 DIAGNOSIS — H9319 Tinnitus, unspecified ear: Secondary | ICD-10-CM | POA: Diagnosis not present

## 2022-11-19 DIAGNOSIS — H9190 Unspecified hearing loss, unspecified ear: Secondary | ICD-10-CM | POA: Diagnosis not present

## 2022-11-19 DIAGNOSIS — E559 Vitamin D deficiency, unspecified: Secondary | ICD-10-CM | POA: Diagnosis not present

## 2023-02-04 DIAGNOSIS — H9313 Tinnitus, bilateral: Secondary | ICD-10-CM | POA: Diagnosis not present

## 2023-02-04 DIAGNOSIS — H903 Sensorineural hearing loss, bilateral: Secondary | ICD-10-CM | POA: Diagnosis not present

## 2023-02-13 ENCOUNTER — Ambulatory Visit
Admission: RE | Admit: 2023-02-13 | Discharge: 2023-02-13 | Disposition: A | Discharge status: Home / Self Care | Payer: BC Managed Care – PPO | Source: Ambulatory Visit | Attending: Nurse Practitioner | Admitting: Nurse Practitioner

## 2023-02-13 DIAGNOSIS — Z780 Asymptomatic menopausal state: Secondary | ICD-10-CM

## 2023-02-13 DIAGNOSIS — M8589 Other specified disorders of bone density and structure, multiple sites: Secondary | ICD-10-CM | POA: Diagnosis not present

## 2023-03-30 ENCOUNTER — Other Ambulatory Visit: Payer: Self-pay | Admitting: Family Medicine

## 2023-03-30 DIAGNOSIS — Z1231 Encounter for screening mammogram for malignant neoplasm of breast: Secondary | ICD-10-CM

## 2023-04-14 DIAGNOSIS — H903 Sensorineural hearing loss, bilateral: Secondary | ICD-10-CM | POA: Diagnosis not present

## 2023-04-15 ENCOUNTER — Ambulatory Visit: Payer: BC Managed Care – PPO

## 2023-04-15 NOTE — Progress Notes (Signed)
Nutrition Consult: 04/15/2023  CC:  " Recently, I had a bone scan and was told that I had developed osteopenia."  HX: Has been active all her life.  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   Notes that she daily walks 4 miles and does yoga for stretching 2 days a week. Since her diagnosis of osteopenia and her work schedule has changed: she has tried to be physically active 3 times per day along with getting calcium in her diet, and taking her 1000 mg of Vitamin D daily.   She id seeking more nutritional information for preventing the osteopenia from increasing and developing osteoporosis.  General dietary recall reveals a healthy eating pattern.  She eats a variety of meats and fish along with a variety of non-starchy vegetables and fruit. Has some starch in her diet and primarily plant based fats. She is lactose intolerant  and unable to drink milk, but is able to eat hard cheese and some yogurts.  Provided the handout " Osteoporosis Nutrition Therapy'" and we reviewed it's content foods to include in her diet that she is able to eat.  Provided handout, " Vitamin K Content of Foods."  We discussed how both vitamin D and Vitamin K facilitate the uptake of calcium into the bone.  Plan; She will continue to eat as healthy as possible and maintain a high exercise/activity level as possible for preventing the onset of osteoporosis.  Maggie Dequane Strahan, RN, RD, LDN

## 2023-05-15 ENCOUNTER — Ambulatory Visit: Payer: BC Managed Care – PPO

## 2023-05-15 ENCOUNTER — Ambulatory Visit
Admission: RE | Admit: 2023-05-15 | Discharge: 2023-05-15 | Disposition: A | Discharge status: Home / Self Care | Payer: BC Managed Care – PPO | Source: Ambulatory Visit | Attending: Family Medicine | Admitting: Family Medicine

## 2023-05-15 DIAGNOSIS — Z1231 Encounter for screening mammogram for malignant neoplasm of breast: Secondary | ICD-10-CM | POA: Diagnosis not present

## 2023-05-19 DIAGNOSIS — D171 Benign lipomatous neoplasm of skin and subcutaneous tissue of trunk: Secondary | ICD-10-CM | POA: Diagnosis not present

## 2023-05-19 DIAGNOSIS — L578 Other skin changes due to chronic exposure to nonionizing radiation: Secondary | ICD-10-CM | POA: Diagnosis not present

## 2023-05-28 ENCOUNTER — Other Ambulatory Visit: Payer: Self-pay | Admitting: Nurse Practitioner

## 2023-05-28 ENCOUNTER — Other Ambulatory Visit (HOSPITAL_COMMUNITY)
Admission: RE | Admit: 2023-05-28 | Discharge: 2023-05-28 | Disposition: A | Discharge status: Home / Self Care | Payer: BC Managed Care – PPO | Source: Ambulatory Visit | Attending: Nurse Practitioner | Admitting: Nurse Practitioner

## 2023-05-28 DIAGNOSIS — Z124 Encounter for screening for malignant neoplasm of cervix: Secondary | ICD-10-CM | POA: Insufficient documentation

## 2023-05-28 DIAGNOSIS — Z01419 Encounter for gynecological examination (general) (routine) without abnormal findings: Secondary | ICD-10-CM | POA: Diagnosis not present

## 2023-06-01 LAB — CYTOLOGY - PAP
Adequacy: ABSENT
Comment: NEGATIVE
Diagnosis: UNDETERMINED — AB
High risk HPV: NEGATIVE

## 2023-06-11 DIAGNOSIS — D235 Other benign neoplasm of skin of trunk: Secondary | ICD-10-CM | POA: Diagnosis not present

## 2023-06-11 DIAGNOSIS — D171 Benign lipomatous neoplasm of skin and subcutaneous tissue of trunk: Secondary | ICD-10-CM | POA: Diagnosis not present

## 2023-06-11 DIAGNOSIS — R208 Other disturbances of skin sensation: Secondary | ICD-10-CM | POA: Diagnosis not present

## 2023-06-22 DIAGNOSIS — H2513 Age-related nuclear cataract, bilateral: Secondary | ICD-10-CM | POA: Diagnosis not present

## 2023-06-22 DIAGNOSIS — H524 Presbyopia: Secondary | ICD-10-CM | POA: Diagnosis not present

## 2023-07-07 DIAGNOSIS — D225 Melanocytic nevi of trunk: Secondary | ICD-10-CM | POA: Diagnosis not present

## 2023-07-07 DIAGNOSIS — D2262 Melanocytic nevi of left upper limb, including shoulder: Secondary | ICD-10-CM | POA: Diagnosis not present

## 2023-07-07 DIAGNOSIS — D2272 Melanocytic nevi of left lower limb, including hip: Secondary | ICD-10-CM | POA: Diagnosis not present

## 2023-07-07 DIAGNOSIS — D2261 Melanocytic nevi of right upper limb, including shoulder: Secondary | ICD-10-CM | POA: Diagnosis not present

## 2023-07-17 DIAGNOSIS — Z23 Encounter for immunization: Secondary | ICD-10-CM | POA: Diagnosis not present

## 2023-08-06 DIAGNOSIS — H903 Sensorineural hearing loss, bilateral: Secondary | ICD-10-CM | POA: Diagnosis not present

## 2023-08-06 DIAGNOSIS — H9313 Tinnitus, bilateral: Secondary | ICD-10-CM | POA: Diagnosis not present

## 2023-08-06 DIAGNOSIS — J309 Allergic rhinitis, unspecified: Secondary | ICD-10-CM | POA: Diagnosis not present

## 2023-08-25 DIAGNOSIS — Z23 Encounter for immunization: Secondary | ICD-10-CM | POA: Diagnosis not present

## 2023-11-25 DIAGNOSIS — Z1331 Encounter for screening for depression: Secondary | ICD-10-CM | POA: Diagnosis not present

## 2023-11-25 DIAGNOSIS — Z0000 Encounter for general adult medical examination without abnormal findings: Secondary | ICD-10-CM | POA: Diagnosis not present

## 2023-12-02 ENCOUNTER — Other Ambulatory Visit: Payer: Self-pay

## 2023-12-02 DIAGNOSIS — Z0000 Encounter for general adult medical examination without abnormal findings: Secondary | ICD-10-CM

## 2023-12-04 ENCOUNTER — Other Ambulatory Visit: Payer: BC Managed Care – PPO

## 2023-12-04 DIAGNOSIS — E559 Vitamin D deficiency, unspecified: Secondary | ICD-10-CM

## 2023-12-04 DIAGNOSIS — Z0000 Encounter for general adult medical examination without abnormal findings: Secondary | ICD-10-CM

## 2023-12-06 LAB — LIPID PANEL

## 2023-12-07 LAB — CBC
Hematocrit: 40.7 % (ref 34.0–46.6)
Hemoglobin: 13.5 g/dL (ref 11.1–15.9)
MCH: 30.3 pg (ref 26.6–33.0)
MCHC: 33.2 g/dL (ref 31.5–35.7)
MCV: 91 fL (ref 79–97)
RBC: 4.46 x10E6/uL (ref 3.77–5.28)
RDW: 13.1 % (ref 11.7–15.4)
WBC: 5.5 10*3/uL (ref 3.4–10.8)

## 2023-12-07 LAB — COMPREHENSIVE METABOLIC PANEL
ALT: 16 IU/L (ref 0–32)
AST: 23 IU/L (ref 0–40)
Albumin: 4.4 g/dL (ref 3.9–4.9)
Alkaline Phosphatase: 69 IU/L (ref 44–121)
BUN/Creatinine Ratio: 17 (ref 12–28)
BUN: 13 mg/dL (ref 8–27)
Bilirubin Total: 0.7 mg/dL (ref 0.0–1.2)
CO2: 22 mmol/L (ref 20–29)
Calcium: 9.3 mg/dL (ref 8.7–10.3)
Chloride: 97 mmol/L (ref 96–106)
Creatinine, Ser: 0.76 mg/dL (ref 0.57–1.00)
Globulin, Total: 2.3 g/dL (ref 1.5–4.5)
Glucose: 91 mg/dL (ref 70–99)
Potassium: 4.4 mmol/L (ref 3.5–5.2)
Sodium: 134 mmol/L (ref 134–144)
Total Protein: 6.7 g/dL (ref 6.0–8.5)
eGFR: 86 mL/min/{1.73_m2} (ref >59)

## 2023-12-07 LAB — LIPID PANEL
Cholesterol, Total: 184 mg/dL (ref 100–199)
HDL: 83 mg/dL (ref >39)
LDL CALC COMMENT:: 2.2 ratio (ref 0.0–4.4)
LDL Chol Calc (NIH): 89 mg/dL (ref 0–99)
Triglycerides: 65 mg/dL (ref 0–149)
VLDL Cholesterol Cal: 12 mg/dL (ref 5–40)

## 2023-12-07 LAB — HEMOGLOBIN A1C
Est. average glucose Bld gHb Est-mCnc: 117 mg/dL
Hgb A1c MFr Bld: 5.7 % — ABNORMAL HIGH (ref 4.8–5.6)

## 2023-12-07 LAB — VITAMIN D 25 HYDROXY (VIT D DEFICIENCY, FRACTURES): Vit D, 25-Hydroxy: 35.9 ng/mL (ref 30.0–100.0)

## 2023-12-07 LAB — TSH: TSH: 2.21 u[IU]/mL (ref 0.450–4.500)

## 2024-03-09 DIAGNOSIS — L821 Other seborrheic keratosis: Secondary | ICD-10-CM | POA: Diagnosis not present

## 2024-03-09 DIAGNOSIS — M71341 Other bursal cyst, right hand: Secondary | ICD-10-CM | POA: Diagnosis not present

## 2024-03-09 DIAGNOSIS — L905 Scar conditions and fibrosis of skin: Secondary | ICD-10-CM | POA: Diagnosis not present

## 2024-03-16 DIAGNOSIS — H903 Sensorineural hearing loss, bilateral: Secondary | ICD-10-CM | POA: Diagnosis not present

## 2024-04-01 ENCOUNTER — Other Ambulatory Visit: Payer: Self-pay | Admitting: Family Medicine

## 2024-04-01 DIAGNOSIS — Z1231 Encounter for screening mammogram for malignant neoplasm of breast: Secondary | ICD-10-CM

## 2024-04-15 DIAGNOSIS — Z23 Encounter for immunization: Secondary | ICD-10-CM | POA: Diagnosis not present

## 2024-05-07 DIAGNOSIS — J029 Acute pharyngitis, unspecified: Secondary | ICD-10-CM | POA: Diagnosis not present

## 2024-05-16 ENCOUNTER — Ambulatory Visit
Admission: RE | Admit: 2024-05-16 | Discharge: 2024-05-16 | Disposition: A | Discharge status: Home / Self Care | Source: Ambulatory Visit | Attending: Family Medicine | Admitting: Family Medicine

## 2024-05-16 DIAGNOSIS — Z1231 Encounter for screening mammogram for malignant neoplasm of breast: Secondary | ICD-10-CM

## 2024-05-18 ENCOUNTER — Other Ambulatory Visit: Payer: Self-pay | Admitting: Family Medicine

## 2024-05-18 DIAGNOSIS — R928 Other abnormal and inconclusive findings on diagnostic imaging of breast: Secondary | ICD-10-CM

## 2024-05-23 ENCOUNTER — Ambulatory Visit
Admission: RE | Admit: 2024-05-23 | Discharge: 2024-05-23 | Disposition: A | Discharge status: Home / Self Care | Source: Ambulatory Visit | Attending: Family Medicine | Admitting: Family Medicine

## 2024-05-23 DIAGNOSIS — R92331 Mammographic heterogeneous density, right breast: Secondary | ICD-10-CM | POA: Diagnosis not present

## 2024-05-23 DIAGNOSIS — R928 Other abnormal and inconclusive findings on diagnostic imaging of breast: Secondary | ICD-10-CM | POA: Diagnosis not present

## 2024-05-23 DIAGNOSIS — N6313 Unspecified lump in the right breast, lower outer quadrant: Secondary | ICD-10-CM | POA: Diagnosis not present

## 2024-05-23 DIAGNOSIS — R92321 Mammographic fibroglandular density, right breast: Secondary | ICD-10-CM | POA: Diagnosis not present

## 2024-05-23 DIAGNOSIS — R922 Inconclusive mammogram: Secondary | ICD-10-CM | POA: Diagnosis not present

## 2024-06-09 DIAGNOSIS — Z01419 Encounter for gynecological examination (general) (routine) without abnormal findings: Secondary | ICD-10-CM | POA: Diagnosis not present

## 2024-06-09 DIAGNOSIS — N889 Noninflammatory disorder of cervix uteri, unspecified: Secondary | ICD-10-CM | POA: Diagnosis not present

## 2024-06-14 DIAGNOSIS — M67441 Ganglion, right hand: Secondary | ICD-10-CM | POA: Diagnosis not present

## 2024-06-16 DIAGNOSIS — D2271 Melanocytic nevi of right lower limb, including hip: Secondary | ICD-10-CM | POA: Diagnosis not present

## 2024-06-16 DIAGNOSIS — D2261 Melanocytic nevi of right upper limb, including shoulder: Secondary | ICD-10-CM | POA: Diagnosis not present

## 2024-06-16 DIAGNOSIS — D225 Melanocytic nevi of trunk: Secondary | ICD-10-CM | POA: Diagnosis not present

## 2024-06-16 DIAGNOSIS — D2262 Melanocytic nevi of left upper limb, including shoulder: Secondary | ICD-10-CM | POA: Diagnosis not present

## 2024-07-12 DIAGNOSIS — H524 Presbyopia: Secondary | ICD-10-CM | POA: Diagnosis not present

## 2024-07-12 DIAGNOSIS — H35371 Puckering of macula, right eye: Secondary | ICD-10-CM | POA: Diagnosis not present
